# Patient Record
Sex: Female | Born: 1990 | Race: Black or African American | Hispanic: No | Marital: Single | Smoking: Former smoker
Health system: Southern US, Community
[De-identification: ages and names within clinical notes are randomized; demographics above are authoritative.]

## PROBLEM LIST (undated history)

## (undated) DIAGNOSIS — D649 Anemia, unspecified: Secondary | ICD-10-CM

## (undated) DIAGNOSIS — K8020 Calculus of gallbladder without cholecystitis without obstruction: Secondary | ICD-10-CM

## (undated) HISTORY — PX: OTHER SURGICAL HISTORY: SHX169

## (undated) HISTORY — PX: TONSILLECTOMY: SUR1361

---

## 2011-06-19 LAB — HEPATITIS B SURFACE ANTIGEN: Hepatitis B Surface Ag: NEGATIVE

## 2011-10-28 ENCOUNTER — Inpatient Hospital Stay (HOSPITAL_COMMUNITY)
Admission: AD | Admit: 2011-10-28 | Discharge: 2011-10-28 | Disposition: A | Discharge status: Home / Self Care | Payer: BC Managed Care – PPO | Source: Ambulatory Visit | Attending: Obstetrics and Gynecology | Admitting: Obstetrics and Gynecology

## 2011-10-28 ENCOUNTER — Encounter (HOSPITAL_COMMUNITY): Payer: Self-pay | Admitting: *Deleted

## 2011-10-28 DIAGNOSIS — O479 False labor, unspecified: Secondary | ICD-10-CM | POA: Insufficient documentation

## 2011-10-28 HISTORY — DX: Anemia, unspecified: D64.9

## 2011-10-28 NOTE — Progress Notes (Signed)
Pt c/o contractions that were 6 min apart X60 for a while then spaced out but are longer and painful.  No bleeding or dysuria.

## 2011-10-29 ENCOUNTER — Inpatient Hospital Stay (HOSPITAL_COMMUNITY)
Admission: AD | Admit: 2011-10-29 | Discharge: 2011-11-01 | DRG: 373 | Disposition: A | Discharge status: Home / Self Care | Payer: BC Managed Care – PPO | Source: Ambulatory Visit | Attending: Obstetrics and Gynecology | Admitting: Obstetrics and Gynecology

## 2011-10-29 ENCOUNTER — Encounter (HOSPITAL_COMMUNITY): Payer: Self-pay | Admitting: *Deleted

## 2011-10-29 ENCOUNTER — Encounter (HOSPITAL_COMMUNITY): Payer: Self-pay | Admitting: Anesthesiology

## 2011-10-29 ENCOUNTER — Inpatient Hospital Stay (HOSPITAL_COMMUNITY): Payer: BC Managed Care – PPO | Admitting: Anesthesiology

## 2011-10-29 DIAGNOSIS — Z22330 Carrier of Group B streptococcus: Secondary | ICD-10-CM

## 2011-10-29 DIAGNOSIS — O99892 Other specified diseases and conditions complicating childbirth: Secondary | ICD-10-CM | POA: Diagnosis present

## 2011-10-29 LAB — ABO/RH

## 2011-10-29 LAB — CBC
MCH: 29.4 pg (ref 26.0–34.0)
Platelets: 263 10*3/uL (ref 150–400)
RBC: 4.35 MIL/uL (ref 3.87–5.11)
RDW: 13.3 % (ref 11.5–15.5)

## 2011-10-29 LAB — RAPID HIV SCREEN (WH-MAU): Rapid HIV Screen: NONREACTIVE

## 2011-10-29 MED ORDER — PENICILLIN G POTASSIUM 5000000 UNITS IJ SOLR
2.5000 10*6.[IU] | INTRAVENOUS | Status: DC
  Administered 2011-10-30: 2.5 10*6.[IU] via INTRAVENOUS
  Filled 2011-10-29 (×11): qty 2.5

## 2011-10-29 MED ORDER — BUTORPHANOL TARTRATE 2 MG/ML IJ SOLN: 2.0000 mg | INTRAMUSCULAR | Status: DC | PRN

## 2011-10-29 MED ORDER — FLEET ENEMA 7-19 GM/118ML RE ENEM: 1.0000 | ENEMA | RECTAL | Status: DC | PRN

## 2011-10-29 MED ORDER — ACETAMINOPHEN 325 MG PO TABS: 650.0000 mg | ORAL_TABLET | ORAL | Status: DC | PRN

## 2011-10-29 MED ORDER — PHENYLEPHRINE 40 MCG/ML (10ML) SYRINGE FOR IV PUSH (FOR BLOOD PRESSURE SUPPORT): 80.0000 ug | PREFILLED_SYRINGE | INTRAVENOUS | Status: DC | PRN

## 2011-10-29 MED ORDER — LACTATED RINGERS IV SOLN
500.0000 mL | Freq: Once | INTRAVENOUS | Status: AC
  Administered 2011-10-29: 1000 mL via INTRAVENOUS

## 2011-10-29 MED ORDER — ONDANSETRON HCL 4 MG/2ML IJ SOLN: 4.0000 mg | Freq: Four times a day (QID) | INTRAMUSCULAR | Status: DC | PRN

## 2011-10-29 MED ORDER — OXYTOCIN 20 UNITS IN LACTATED RINGERS INFUSION - SIMPLE
125.0000 mL/h | Freq: Once | INTRAVENOUS | Status: AC
  Administered 2011-10-30: 999 mL/h via INTRAVENOUS
  Filled 2011-10-29: qty 1000

## 2011-10-29 MED ORDER — DIPHENHYDRAMINE HCL 50 MG/ML IJ SOLN: 12.5000 mg | INTRAMUSCULAR | Status: DC | PRN

## 2011-10-29 MED ORDER — OXYTOCIN BOLUS FROM INFUSION
500.0000 mL | Freq: Once | INTRAVENOUS | Status: DC
  Filled 2011-10-29: qty 500

## 2011-10-29 MED ORDER — LIDOCAINE HCL (PF) 1 % IJ SOLN: 30.0000 mL | INTRAMUSCULAR | Status: DC | PRN

## 2011-10-29 MED ORDER — OXYCODONE-ACETAMINOPHEN 5-325 MG PO TABS: 2.0000 | ORAL_TABLET | ORAL | Status: DC | PRN

## 2011-10-29 MED ORDER — FENTANYL 2.5 MCG/ML BUPIVACAINE 1/10 % EPIDURAL INFUSION (WH - ANES)
14.0000 mL/h | INTRAMUSCULAR | Status: DC
  Administered 2011-10-29 – 2011-10-30 (×2): 14 mL/h via EPIDURAL
  Filled 2011-10-29 (×3): qty 60

## 2011-10-29 MED ORDER — LACTATED RINGERS IV SOLN
INTRAVENOUS | Status: DC
  Administered 2011-10-29: 20:00:00 via INTRAVENOUS

## 2011-10-29 MED ORDER — EPHEDRINE 5 MG/ML INJ: 10.0000 mg | INTRAVENOUS | Status: DC | PRN

## 2011-10-29 MED ORDER — LACTATED RINGERS IV SOLN: 500.0000 mL | INTRAVENOUS | Status: DC | PRN

## 2011-10-29 MED ORDER — EPHEDRINE 5 MG/ML INJ
10.0000 mg | INTRAVENOUS | Status: DC | PRN
  Filled 2011-10-29: qty 4

## 2011-10-29 MED ORDER — SODIUM CHLORIDE 0.9 % IV SOLN
2.0000 g | Freq: Once | INTRAVENOUS | Status: AC
  Administered 2011-10-29: 2 g via INTRAVENOUS
  Filled 2011-10-29: qty 2000

## 2011-10-29 MED ORDER — IBUPROFEN 600 MG PO TABS: 600.0000 mg | ORAL_TABLET | Freq: Four times a day (QID) | ORAL | Status: DC | PRN

## 2011-10-29 MED ORDER — FENTANYL 2.5 MCG/ML BUPIVACAINE 1/10 % EPIDURAL INFUSION (WH - ANES)
INTRAMUSCULAR | Status: DC | PRN
  Administered 2011-10-29: 14 mL/h via EPIDURAL

## 2011-10-29 MED ORDER — LIDOCAINE HCL 1.5 % IJ SOLN
INTRAMUSCULAR | Status: DC | PRN
  Administered 2011-10-29 (×2): 5 mL via EPIDURAL

## 2011-10-29 MED ORDER — PHENYLEPHRINE 40 MCG/ML (10ML) SYRINGE FOR IV PUSH (FOR BLOOD PRESSURE SUPPORT)
80.0000 ug | PREFILLED_SYRINGE | INTRAVENOUS | Status: DC | PRN
  Filled 2011-10-29: qty 5

## 2011-10-29 MED ORDER — CITRIC ACID-SODIUM CITRATE 334-500 MG/5ML PO SOLN: 30.0000 mL | ORAL | Status: DC | PRN

## 2011-10-29 NOTE — Anesthesia Procedure Notes (Signed)
Epidural Patient location during procedure: OB Start time: 10/29/2011 8:09 PM End time: 10/29/2011 8:14 PM Reason for block: procedure for pain  Staffing Anesthesiologist: Sandrea Hughs Performed by: anesthesiologist   Preanesthetic Checklist Completed: patient identified, site marked, surgical consent, pre-op evaluation, timeout performed, IV checked, risks and benefits discussed and monitors and equipment checked  Epidural Patient position: sitting Prep: site prepped and draped and DuraPrep Patient monitoring: continuous pulse ox and blood pressure Approach: midline Injection technique: LOR air  Needle:  Needle type: Tuohy  Needle gauge: 17 G Needle length: 9 cm Needle insertion depth: 7 cm Catheter type: closed end flexible Catheter size: 19 Gauge Catheter at skin depth: 12 cm Test dose: negative and 1.5% lidocaine  Assessment Sensory level: T8 Events: blood not aspirated, injection not painful, no injection resistance, negative IV test and no paresthesia

## 2011-10-29 NOTE — H&P (Signed)
Admission H&P:  Patient is a 20 y.o. G1P0 at [redacted]w[redacted]d who presents with complaints of contractions.  ? Leakage of fluid per nurse on admission.  Patient was 6 cm on admission and was admitted summarily to L&D.  Since then, patient received epidural and is now comfortable.    PMH:  None. PSH:  Tonsillectomy, tubes in ears. POB:  G1P0 PGYN:  + h/o chlam this pregnancy.  TOC neg.  Denies HSV, other std's, abnormal paps. Meds:  None. Allergies:  Claritin, lobster, stingray.  Prenatal labs:  B+, Rubella 28, NR, HepB neg, HIV not seen in prenatal labs.  PE:  AFVSS. Abd:  Nontender, gravid. SVE:  6/80/-1.  Sutures palpable.  140's-150's, + accels, no decels, + variability. Toco:  q 4-5 min.  AROM:  Moderate meconium noted.  No immediate complications noted.  Fetus tolerated procedure well.     A/P:  IUP at term, active labor.  No recent change from admission s/p epidural.  AROM performed.  Will check in 2 hours and if no change consider augmentation.  No HIV readily apparent in prenatal record.  Nurse called lab.  Will re-send if no HIV result per lab previously.  GBS positive; on penicillin.  Otherwise, expectant management.

## 2011-10-29 NOTE — Progress Notes (Signed)
Pt been contracting strong for the past two hours.  Says she is leaking some

## 2011-10-29 NOTE — Anesthesia Preprocedure Evaluation (Signed)
Anesthesia Evaluation  Patient identified by MRN, date of birth, ID band Patient awake    Reviewed: Allergy & Precautions, H&P , NPO status , Patient's Chart, lab work & pertinent test results  Airway Mallampati: II TM Distance: >3 FB Neck ROM: full    Dental No notable dental hx.    Pulmonary neg pulmonary ROS,    Pulmonary exam normal       Cardiovascular neg cardio ROS     Neuro/Psych Negative Neurological ROS  Negative Psych ROS   GI/Hepatic negative GI ROS, Neg liver ROS,   Endo/Other  Negative Endocrine ROS  Renal/GU negative Renal ROS  Genitourinary negative   Musculoskeletal negative musculoskeletal ROS (+)   Abdominal Normal abdominal exam  (+)   Peds negative pediatric ROS (+)  Hematology negative hematology ROS (+)   Anesthesia Other Findings   Reproductive/Obstetrics (+) Pregnancy                           Anesthesia Physical Anesthesia Plan  ASA: II  Anesthesia Plan: Epidural   Post-op Pain Management:    Induction:   Airway Management Planned:   Additional Equipment:   Intra-op Plan:   Post-operative Plan:   Informed Consent: I have reviewed the patients History and Physical, chart, labs and discussed the procedure including the risks, benefits and alternatives for the proposed anesthesia with the patient or authorized representative who has indicated his/her understanding and acceptance.     Plan Discussed with:   Anesthesia Plan Comments:         Anesthesia Quick Evaluation  

## 2011-10-30 ENCOUNTER — Encounter (HOSPITAL_COMMUNITY): Payer: Self-pay | Admitting: *Deleted

## 2011-10-30 LAB — ABO/RH: ABO/RH(D): B POS

## 2011-10-30 MED ORDER — ONDANSETRON HCL 4 MG PO TABS: 4.0000 mg | ORAL_TABLET | ORAL | Status: DC | PRN

## 2011-10-30 MED ORDER — OXYCODONE-ACETAMINOPHEN 5-325 MG PO TABS
1.0000 | ORAL_TABLET | ORAL | Status: DC | PRN
  Administered 2011-10-30: 1 via ORAL
  Filled 2011-10-30: qty 1

## 2011-10-30 MED ORDER — SENNOSIDES-DOCUSATE SODIUM 8.6-50 MG PO TABS
2.0000 | ORAL_TABLET | Freq: Every day | ORAL | Status: DC
  Administered 2011-10-30 – 2011-10-31 (×2): 2 via ORAL

## 2011-10-30 MED ORDER — IBUPROFEN 600 MG PO TABS
600.0000 mg | ORAL_TABLET | Freq: Four times a day (QID) | ORAL | Status: DC
  Administered 2011-10-30 – 2011-11-01 (×9): 600 mg via ORAL
  Filled 2011-10-30 (×9): qty 1

## 2011-10-30 MED ORDER — SIMETHICONE 80 MG PO CHEW: 80.0000 mg | CHEWABLE_TABLET | ORAL | Status: DC | PRN

## 2011-10-30 MED ORDER — PRENATAL PLUS 27-1 MG PO TABS
1.0000 | ORAL_TABLET | Freq: Every day | ORAL | Status: DC
  Administered 2011-10-30 – 2011-11-01 (×3): 1 via ORAL
  Filled 2011-10-30 (×3): qty 1

## 2011-10-30 MED ORDER — DIPHENHYDRAMINE HCL 25 MG PO CAPS: 25.0000 mg | ORAL_CAPSULE | Freq: Four times a day (QID) | ORAL | Status: DC | PRN

## 2011-10-30 MED ORDER — BENZOCAINE-MENTHOL 20-0.5 % EX AERO
1.0000 "application " | INHALATION_SPRAY | CUTANEOUS | Status: DC | PRN
  Administered 2011-10-31: 1 via TOPICAL

## 2011-10-30 MED ORDER — TETANUS-DIPHTH-ACELL PERTUSSIS 5-2.5-18.5 LF-MCG/0.5 IM SUSP: 0.5000 mL | Freq: Once | INTRAMUSCULAR | Status: DC

## 2011-10-30 MED ORDER — WITCH HAZEL-GLYCERIN EX PADS: 1.0000 "application " | MEDICATED_PAD | CUTANEOUS | Status: DC | PRN

## 2011-10-30 MED ORDER — ZOLPIDEM TARTRATE 5 MG PO TABS: 5.0000 mg | ORAL_TABLET | Freq: Every evening | ORAL | Status: DC | PRN

## 2011-10-30 MED ORDER — LANOLIN HYDROUS EX OINT: TOPICAL_OINTMENT | CUTANEOUS | Status: DC | PRN

## 2011-10-30 MED ORDER — ONDANSETRON HCL 4 MG/2ML IJ SOLN: 4.0000 mg | INTRAMUSCULAR | Status: DC | PRN

## 2011-10-30 MED ORDER — DIBUCAINE 1 % RE OINT: 1.0000 "application " | TOPICAL_OINTMENT | RECTAL | Status: DC | PRN

## 2011-10-30 MED ORDER — LIDOCAINE HCL (PF) 1 % IJ SOLN
INTRAMUSCULAR | Status: AC
  Filled 2011-10-30: qty 30

## 2011-10-30 NOTE — Anesthesia Postprocedure Evaluation (Signed)
Anesthesia Post Note  Patient: Barbara Shields  Procedure(s) Performed: * No procedures listed *  Anesthesia type: Epidural  Patient location: Mother/Baby  Post pain: Pain level controlled  Post assessment: Post-op Vital signs reviewed  Last Vitals:  Filed Vitals:   10/30/11 0523  BP: 122/70  Pulse: 88  Temp:   Resp: 18    Post vital signs: Reviewed  Level of consciousness: awake  Complications: No apparent anesthesia complications

## 2011-10-30 NOTE — Progress Notes (Signed)
Delivery Note:    Preop:  IUP at term, active labor, light-moderate meconium. Postop:  Same Procedure:  SVD/2nd Attending:  Jonetta Dagley EBL:  250 Path:  Cord blood. Comps:  None Anesthesia:  Epidural. Findings:  Viable infant female with Apgars of 8 at 1 min and 9 at 5 min weighing 6.b 7 oz.  Neonate bulb-suctioned at perineum.  Anterior shoulder easily delivered without difficulty.  Placenta delivered spontaneously; 3vc noted.  Placenta inspected and noted to be intact.  Small 2nd degree perineal laceration repaired in routine fashion with 2.0 vicryl.  Mother and infant stable after delivery.

## 2011-10-30 NOTE — Anesthesia Postprocedure Evaluation (Signed)
  Anesthesia Post-op Note  Patient: Barbara Shields  Procedure(s) Performed: * No procedures listed *  Patient Location: Mother/Baby  Anesthesia Type: Epidural  Level of Consciousness: awake  Airway and Oxygen Therapy: Patient Spontanous Breathing  Post-op Pain: none  Post-op Assessment: Patient's Cardiovascular Status Stable, Respiratory Function Stable, No signs of Nausea or vomiting, Adequate PO intake and Pain level controlled  Post-op Vital Signs: Reviewed and stable  Complications: No apparent anesthesia complications

## 2011-10-30 NOTE — Addendum Note (Signed)
Addendum  created 10/30/11 0906 by Suella Grove   Modules edited:Charges VN, Notes Section

## 2011-10-30 NOTE — Addendum Note (Signed)
Addendum  created 10/30/11 0906 by Lasheka Kempner C Nessie Nong   Modules edited:Charges VN, Notes Section    

## 2011-10-31 LAB — CBC
HCT: 32.3 % — ABNORMAL LOW (ref 36.0–46.0)
Hemoglobin: 10.8 g/dL — ABNORMAL LOW (ref 12.0–15.0)
MCV: 88 fL (ref 78.0–100.0)
RBC: 3.67 MIL/uL — ABNORMAL LOW (ref 3.87–5.11)
RDW: 13.7 % (ref 11.5–15.5)
WBC: 11.5 10*3/uL — ABNORMAL HIGH (ref 4.0–10.5)

## 2011-10-31 MED ORDER — BENZOCAINE-MENTHOL 20-0.5 % EX AERO
INHALATION_SPRAY | CUTANEOUS | Status: AC
  Filled 2011-10-31: qty 56

## 2011-10-31 NOTE — Progress Notes (Signed)
Post Partum Day 1 Subjective: up ad lib, voiding, tolerating PO and + flatus  Objective: Blood pressure 111/72, pulse 93, temperature 98.4 F (36.9 C), temperature source Oral, resp. rate 20, height 5\' 5"  (1.651 m), weight 89.359 kg (197 lb), SpO2 97.00%, unknown if currently breastfeeding.  Physical Exam:  General: alert and cooperative Lochia: appropriate Uterine Fundus: firm Incision: NA DVT Evaluation: No evidence of DVT seen on physical exam.   Basename 10/31/11 0510 10/29/11 1840  HGB 10.8* 12.8  HCT 32.3* 37.7    Assessment/Plan: Plan for discharge tomorrow and Circumcision prior to discharge   LOS: 2 days   Lynwood Kubisiak J. 10/31/2011, 8:18 AM

## 2011-11-01 MED ORDER — IBUPROFEN 600 MG PO TABS: 600.0000 mg | ORAL_TABLET | Freq: Four times a day (QID) | ORAL | Status: AC | PRN

## 2011-11-01 NOTE — Discharge Summary (Signed)
Obstetric Discharge Summary Reason for Admission: onset of labor Prenatal Procedures: none Intrapartum Procedures: spontaneous vaginal delivery Postpartum Procedures: none Complications-Operative and Postpartum: 2nd  degree perineal laceration Hemoglobin  Date Value Range Status  10/31/2011 10.8* 12.0-15.0 (g/dL) Final     HCT  Date Value Range Status  10/31/2011 32.3* 36.0-46.0 (%) Final    Discharge Diagnoses: Term Pregnancy-delivered  Discharge Information: Date: 11/01/2011 Activity: pelvic rest Diet: routine Medications: PNV and Ibuprofen Condition: stable Instructions: refer to practice specific booklet Discharge to: home Follow-up Information    Follow up with [name]. Make an appointment in 6 weeks.   Contact information:   [address] [place] [address] [phone]          Newborn Data: Live born female  Birth Weight: 6 lb 7.5 oz (2935 g) APGAR: 8, 9  Home with mother.  Barbara Henken J. 11/01/2011, 9:03 AM

## 2011-11-01 NOTE — Progress Notes (Signed)
Post Partum Day 2 Subjective: no complaints, up ad lib, voiding, tolerating PO and + flatus  Objective: Blood pressure 106/70, pulse 108, temperature 98.4 F (36.9 C), temperature source Oral, resp. rate 18, height 5\' 5"  (1.651 m), weight 89.359 kg (197 lb), SpO2 97.00%, unknown if currently breastfeeding.  Physical Exam:  General: alert and cooperative Lochia: appropriate Uterine Fundus: firm Incision: na  DVT Evaluation: No evidence of DVT seen on physical exam.   Basename 10/31/11 0510 10/29/11 1840  HGB 10.8* 12.8  HCT 32.3* 37.7    Assessment/Plan: Discharge home and Breastfeeding   LOS: 3 days   Barbara Shields J. 11/01/2011, 9:00 AM

## 2011-11-07 ENCOUNTER — Encounter (HOSPITAL_COMMUNITY)
Admission: RE | Admit: 2011-11-07 | Discharge: 2011-11-07 | Disposition: A | Discharge status: Home / Self Care | Payer: BC Managed Care – PPO | Source: Ambulatory Visit | Attending: Obstetrics and Gynecology | Admitting: Obstetrics and Gynecology

## 2011-11-07 DIAGNOSIS — O923 Agalactia: Secondary | ICD-10-CM | POA: Insufficient documentation

## 2011-12-08 ENCOUNTER — Encounter (HOSPITAL_COMMUNITY)
Admission: RE | Admit: 2011-12-08 | Discharge: 2011-12-08 | Disposition: A | Discharge status: Home / Self Care | Payer: BC Managed Care – PPO | Source: Ambulatory Visit | Attending: Obstetrics and Gynecology | Admitting: Obstetrics and Gynecology

## 2011-12-08 DIAGNOSIS — O923 Agalactia: Secondary | ICD-10-CM | POA: Insufficient documentation

## 2012-01-08 ENCOUNTER — Encounter (HOSPITAL_COMMUNITY)
Admission: RE | Admit: 2012-01-08 | Discharge: 2012-01-08 | Disposition: A | Discharge status: Home / Self Care | Payer: BC Managed Care – PPO | Source: Ambulatory Visit | Attending: Obstetrics and Gynecology | Admitting: Obstetrics and Gynecology

## 2012-01-08 DIAGNOSIS — O923 Agalactia: Secondary | ICD-10-CM | POA: Insufficient documentation

## 2012-02-08 ENCOUNTER — Encounter (HOSPITAL_COMMUNITY)
Admission: RE | Admit: 2012-02-08 | Discharge: 2012-02-08 | Disposition: A | Discharge status: Home / Self Care | Payer: BC Managed Care – PPO | Source: Ambulatory Visit | Attending: Obstetrics and Gynecology | Admitting: Obstetrics and Gynecology

## 2012-02-08 DIAGNOSIS — O923 Agalactia: Secondary | ICD-10-CM | POA: Insufficient documentation

## 2012-03-09 ENCOUNTER — Encounter (HOSPITAL_COMMUNITY)
Admission: RE | Admit: 2012-03-09 | Discharge: 2012-03-09 | Disposition: A | Discharge status: Home / Self Care | Payer: BC Managed Care – PPO | Source: Ambulatory Visit | Attending: Obstetrics and Gynecology | Admitting: Obstetrics and Gynecology

## 2012-03-09 DIAGNOSIS — O923 Agalactia: Secondary | ICD-10-CM | POA: Insufficient documentation

## 2012-04-10 ENCOUNTER — Other Ambulatory Visit: Payer: Self-pay | Admitting: Family Medicine

## 2012-04-10 ENCOUNTER — Inpatient Hospital Stay: Admission: RE | Admit: 2012-04-10 | Payer: BC Managed Care – PPO | Source: Ambulatory Visit

## 2012-04-16 ENCOUNTER — Ambulatory Visit
Admission: RE | Admit: 2012-04-16 | Discharge: 2012-04-16 | Disposition: A | Discharge status: Home / Self Care | Payer: BC Managed Care – PPO | Source: Ambulatory Visit | Attending: Family Medicine | Admitting: Family Medicine

## 2012-04-16 ENCOUNTER — Other Ambulatory Visit: Payer: Self-pay | Admitting: Family Medicine

## 2012-05-15 ENCOUNTER — Ambulatory Visit (INDEPENDENT_AMBULATORY_CARE_PROVIDER_SITE_OTHER): Payer: BC Managed Care – PPO | Admitting: General Surgery

## 2012-05-15 ENCOUNTER — Encounter (INDEPENDENT_AMBULATORY_CARE_PROVIDER_SITE_OTHER): Payer: Self-pay | Admitting: General Surgery

## 2012-05-15 DIAGNOSIS — K8020 Calculus of gallbladder without cholecystitis without obstruction: Secondary | ICD-10-CM

## 2012-05-15 NOTE — Progress Notes (Signed)
Patient ID: Francesco Runner, female   DOB: Aug 30, 1991, 20 y.o.   MRN: [id number]  Chief Complaint  Patient presents with  . Abdominal Pain    Evaluate Gallbladder w/stones    HPI AFTIN LYE is a 21 y.o. female.   HPI This patient was referred by Dr. Paulino Rily for evaluation of abdominal pain and cholelithiasis. She says that her pain began last May during her pregnancy and since then she has had intermittent episodes of right-sided abdominal pain located mainly in the right lower quadrant. She says this occurs every 3 weeks and last for about a week at a time when it comes. She says that it causes nausea and vomiting and right-sided pain without radiation. She says it is worse with greasy foods. She denies any fevers or chills and states that her bowels are normal without any blood in the stools or melena. She says her bowels are regular and she denies any dysuria or hematuria. She says her menstrual cycles are normal. The pain is becoming more consistent.  Past Medical History  Diagnosis Date  . Anemia     Past Surgical History  Procedure Date  . Tonsillectomy   . Ears tubes in ears    Family History  Problem Relation Age of Onset  . Hypertension Father   . Asthma Father   . Hypertension Maternal Grandmother   . Stroke Maternal Grandmother   . Mental illness Paternal Grandmother   . Mental illness Paternal Grandfather     Social History History  Substance Use Topics  . Smoking status: Former Smoker -- 0.1 packs/day    Types: Cigarettes    Quit date: 10/27/2009  . Smokeless tobacco: Not on file  . Alcohol Use: Yes     shots of vodka monthly during this pregnancy.    Allergies  Allergen Reactions  . Loratadine Hives  . Benzocaine-Menthol Hives    Sting ray  . Chocolate Hives  . Lobster (Shellfish Allergy) Hives    Current Outpatient Prescriptions  Medication Sig Dispense Refill  . calcium carbonate (TUMS - DOSED IN MG ELEMENTAL CALCIUM) 500 MG  chewable tablet Chew 2 tablets by mouth daily as needed. Patient uses this medication for heartburn.       . Fe Fum-FePoly-Vit C-Vit B3 (INTEGRA) 62.5-62.5-40-3 MG CAPS Take 1 capsule by mouth daily.        . prenatal vitamin w/FE, FA (PRENATAL 1 + 1) 27-1 MG TABS Take 1 tablet by mouth daily.          Review of Systems Review of Systems All other review of systems negative or noncontributory except as stated in the HPI  Blood pressure 128/82, temperature 98.4 F (36.9 C), resp. rate 18.  Physical Exam Physical Exam Physical Exam  Nursing note and vitals reviewed. Constitutional: She is oriented to person, place, and time. She appears well-developed and well-nourished. No distress.  HENT:  Head: Normocephalic and atraumatic.  Mouth/Throat: No oropharyngeal exudate.  Eyes: Conjunctivae and EOM are normal. Pupils are equal, round, and reactive to light. Right eye exhibits no discharge. Left eye exhibits no discharge. No scleral icterus.  Neck: Normal range of motion. Neck supple. No tracheal deviation present.  Cardiovascular: Normal rate, regular rhythm, normal heart sounds and intact distal pulses.   Pulmonary/Chest: Effort normal and breath sounds normal. No stridor. No respiratory distress. She has no wheezes.  Abdominal: Soft. Bowel sounds are normal. She exhibits no distension and no mass. There is no tenderness. There is no  rebound and no guarding.  Musculoskeletal: Normal range of motion. She exhibits no edema and no tenderness.  Neurological: She is alert and oriented to person, place, and time.  Skin: Skin is warm and dry. No rash noted. She is not diaphoretic. No erythema. No pallor.  Psychiatric: She has a normal mood and affect. Her behavior is normal. Judgment and thought content normal.    Data Reviewed [place] and labs  Assessment    Symptomatic cholelithiasis Now her location of the pain is not classic for gallbladder symptoms the remainder of her symptoms are fairly  classic. She does have cholelithiasis on ultrasound as well as normal set of LFTs. I discussed with her the options for continued observation versus cholecystectomy in July to proceed with cholecystectomy. I discussed with her the risks of procedure. The risks of infection, bleeding, pain, persistent symptoms, scarring, injury to bowel or bile ducts, retained stone, diarrhea, need for additional procedures, and need for open surgery discussed with the patient. She desires to proceed with cholecystectomy. Again, I did explain that since her symptoms are not located in the classic area and, there is a decent chance that she still has persistent symptoms despite cholecystectomy.     Plan    We will schedule cholecystectomy as soon as available.       Lodema Pilot DAVID 05/15/2012, 9:13 AM

## 2013-07-23 ENCOUNTER — Ambulatory Visit (INDEPENDENT_AMBULATORY_CARE_PROVIDER_SITE_OTHER): Payer: BC Managed Care – PPO | Admitting: General Surgery

## 2013-07-23 ENCOUNTER — Encounter (HOSPITAL_COMMUNITY): Payer: Self-pay | Admitting: Pharmacy Technician

## 2013-07-23 VITALS — BP 128/86 | HR 66 | Temp 98.0°F | Resp 18 | Ht 65.0 in | Wt 221.0 lb

## 2013-07-23 DIAGNOSIS — K8020 Calculus of gallbladder without cholecystitis without obstruction: Secondary | ICD-10-CM

## 2013-07-23 NOTE — Progress Notes (Signed)
Patient ID: Barbara Shields, female   DOB: 09-Nov-1991, 22 y.o.   MRN: [id number]  Chief Complaint  Patient presents with  . Establish Care    gallbladder    HPI Barbara Shields is a 22 y.o. female.  This patient is known to me for evaluation last year for symptomatic cholelithiasis. Surgery was scheduled at that time the tube financial reasons but she says that she continues to have persistent right-sided abdominal pain after eating which radiates to her back. She says that this comes about 2 times per month and lasts for about a week at a time. She has associated nausea but no vomiting. Denies any fevers or chills. She had which demonstrated cholelithiasis. She denies any other symptoms and states that her bowels are normal.  She says that she has a strong family history of similar gallbladder problems which resolved with cholecystectomy. HPI  Past Medical History  Diagnosis Date  . Anemia     Past Surgical History  Procedure Laterality Date  . Tonsillectomy    . Ears  tubes in ears    Family History  Problem Relation Age of Onset  . Hypertension Father   . Asthma Father   . Hypertension Maternal Grandmother   . Stroke Maternal Grandmother   . Mental illness Paternal Grandmother   . Mental illness Paternal Grandfather     Social History History  Substance Use Topics  . Smoking status: Former Smoker -- 0.10 packs/day    Types: Cigarettes    Quit date: 04/27/2011  . Smokeless tobacco: Not on file  . Alcohol Use: Yes     Comment: shots of vodka monthly during this pregnancy.    Allergies  Allergen Reactions  . Loratadine Hives  . Benzocaine-Menthol Hives    Sting ray  . Chocolate Hives  . Lobster (Shellfish Allergy) Hives    Current Outpatient Prescriptions  Medication Sig Dispense Refill  . calcium carbonate (TUMS - DOSED IN MG ELEMENTAL CALCIUM) 500 MG chewable tablet Chew 2 tablets by mouth daily as needed. Patient uses this medication for heartburn.        . etonogestrel (NEXPLANON) 68 MG IMPL implant Inject 1 each into the skin once.      . Fe Fum-FePoly-Vit C-Vit B3 (INTEGRA) 62.5-62.5-40-3 MG CAPS Take 1 capsule by mouth daily.        . prenatal vitamin w/FE, FA (PRENATAL 1 + 1) 27-1 MG TABS Take 1 tablet by mouth daily.         No current facility-administered medications for this visit.    Review of Systems Review of Systems All other review of systems negative or noncontributory except as stated in the HPI   Blood pressure 128/86, pulse 66, temperature 98 F (36.7 C), resp. rate 18, height 5\' 5"  (1.651 m), weight 221 lb (100.245 kg).  Physical Exam Physical Exam Physical Exam  Nursing note and vitals reviewed. Constitutional: She is oriented to person, place, and time. She appears well-developed and well-nourished. No distress.  HENT:  Head: Normocephalic and atraumatic.  Mouth/Throat: No oropharyngeal exudate.  Eyes: Conjunctivae and EOM are normal. Pupils are equal, round, and reactive to light. Right eye exhibits no discharge. Left eye exhibits no discharge. No scleral icterus.  Neck: Normal range of motion. Neck supple. No tracheal deviation present.  Cardiovascular: Normal rate, regular rhythm, normal heart sounds and intact distal pulses.   Pulmonary/Chest: Effort normal and breath sounds normal. No stridor. No respiratory distress. She has no wheezes.  Abdominal:  Soft. Bowel sounds are normal. She exhibits no distension and no mass. There is no tenderness. There is no rebound and no guarding.  Musculoskeletal: Normal range of motion. She exhibits no edema and no tenderness.  Neurological: She is alert and oriented to person, place, and time.  Skin: Skin is warm and dry. No rash noted. She is not diaphoretic. No erythema. No pallor.  Psychiatric: She has a normal mood and affect. Her behavior is normal. Judgment and thought content normal.    Data Reviewed   Assessment    Symptomatic cholelithiasis She has  symptoms which sound pretty classic for symptomatic cholelithiasis. I have discussed with her the options for continued observation versus cholecystectomy she would like to proceed with cholecystectomy. Discussed with her the procedure and its risks. The risks of infection, bleeding, pain, persistent symptoms, scarring, injury to bowel or bile ducts, retained stone, diarrhea, need for additional procedures, and need for open surgery discussed with the patient. She expressed understanding and desires to proceed with lap cholecystectomy.  I again explained that she has a risk of persistent symptoms postoperatively.      Plan    Will set her up for cholecystectomy when convenient for her.        Lodema Pilot DAVID 07/23/2013, 8:57 AM

## 2013-07-27 ENCOUNTER — Other Ambulatory Visit (HOSPITAL_COMMUNITY): Payer: Self-pay | Admitting: *Deleted

## 2013-07-28 ENCOUNTER — Encounter (HOSPITAL_COMMUNITY)
Admission: RE | Admit: 2013-07-28 | Discharge: 2013-07-28 | Disposition: A | Discharge status: Home / Self Care | Payer: BC Managed Care – PPO | Source: Ambulatory Visit | Attending: General Surgery | Admitting: General Surgery

## 2013-07-28 ENCOUNTER — Encounter (HOSPITAL_COMMUNITY): Payer: Self-pay

## 2013-07-28 VITALS — BP 137/90 | HR 84 | Temp 98.5°F | Resp 16 | Ht 65.0 in | Wt 220.5 lb

## 2013-07-28 DIAGNOSIS — K8020 Calculus of gallbladder without cholecystitis without obstruction: Secondary | ICD-10-CM

## 2013-07-28 HISTORY — DX: Calculus of gallbladder without cholecystitis without obstruction: K80.20

## 2013-07-28 LAB — CBC WITH DIFFERENTIAL/PLATELET
Basophils Absolute: 0 10*3/uL (ref 0.0–0.1)
Basophils Relative: 1 % (ref 0–1)
Eosinophils Absolute: 0.2 10*3/uL (ref 0.0–0.7)
Hemoglobin: 12.5 g/dL (ref 12.0–15.0)
MCH: 28.5 pg (ref 26.0–34.0)
MCHC: 33.3 g/dL (ref 30.0–36.0)
Neutro Abs: 2.4 10*3/uL (ref 1.7–7.7)
Neutrophils Relative %: 56 % (ref 43–77)
Platelets: 255 10*3/uL (ref 150–400)
RDW: 13.3 % (ref 11.5–15.5)

## 2013-07-28 LAB — COMPREHENSIVE METABOLIC PANEL
AST: 16 U/L (ref 0–37)
Albumin: 3.7 g/dL (ref 3.5–5.2)
Alkaline Phosphatase: 72 U/L (ref 39–117)
BUN: 6 mg/dL (ref 6–23)
Chloride: 104 mEq/L (ref 96–112)
Potassium: 3.7 mEq/L (ref 3.5–5.1)
Sodium: 139 mEq/L (ref 135–145)
Total Bilirubin: 0.5 mg/dL (ref 0.3–1.2)
Total Protein: 7.4 g/dL (ref 6.0–8.3)

## 2013-07-28 LAB — HCG, SERUM, QUALITATIVE: Preg, Serum: NEGATIVE

## 2013-07-28 NOTE — Patient Instructions (Addendum)
KILYN MARAGH  07/28/2013                           YOUR PROCEDURE IS SCHEDULED ON:07/30/13               PLEASE REPORT TO SHORT STAY CENTER AT :  5:15 am               CALL THIS NUMBER IF ANY PROBLEMS THE DAY OF SURGERY :               [phone]                      REMEMBER:   Do not eat food or drink liquids AFTER MIDNIGHT    Take these medicines the morning of surgery with A SIP OF WATER:  none   Do not wear jewelry, make-up   Do not wear lotions, powders, or perfumes.   Do not shave legs or underarms 12 hrs. before surgery (men may shave face)  Do not bring valuables to the hospital.  Contacts, dentures or bridgework may not be worn into surgery.  Leave suitcase in the car. After surgery it may be brought to your room.  For patients admitted to the hospital more than one night, checkout time is 11:00                          The day of discharge.   Patients discharged the day of surgery will not be allowed to drive home                             If going home same day of surgery, must have someone stay with you first                           24 hrs at home and arrange for some one to drive you home from hospital.    Special Instructions:   Please read over the following fact sheets that you were given:               1. MRSA  INFORMATION                      2. [facility] PREPARING FOR SURGERY SHEET                                                X_____________________________________________________________________        Failure to follow these instructions may result in cancellation of your surgery

## 2013-07-30 ENCOUNTER — Ambulatory Visit (HOSPITAL_COMMUNITY): Payer: BC Managed Care – PPO

## 2013-07-30 ENCOUNTER — Encounter (HOSPITAL_COMMUNITY)
Admission: RE | Disposition: A | Discharge status: Home / Self Care | Payer: Self-pay | Source: Ambulatory Visit | Attending: General Surgery

## 2013-07-30 ENCOUNTER — Encounter (HOSPITAL_COMMUNITY): Payer: Self-pay | Admitting: Anesthesiology

## 2013-07-30 ENCOUNTER — Ambulatory Visit (HOSPITAL_COMMUNITY)
Admission: RE | Admit: 2013-07-30 | Discharge: 2013-07-30 | Disposition: A | Discharge status: Home / Self Care | Payer: BC Managed Care – PPO | Source: Ambulatory Visit | Attending: General Surgery | Admitting: General Surgery

## 2013-07-30 ENCOUNTER — Ambulatory Visit (HOSPITAL_COMMUNITY): Payer: BC Managed Care – PPO | Admitting: Anesthesiology

## 2013-07-30 ENCOUNTER — Encounter (HOSPITAL_COMMUNITY): Payer: Self-pay

## 2013-07-30 DIAGNOSIS — K8010 Calculus of gallbladder with chronic cholecystitis without obstruction: Secondary | ICD-10-CM | POA: Insufficient documentation

## 2013-07-30 DIAGNOSIS — D649 Anemia, unspecified: Secondary | ICD-10-CM | POA: Insufficient documentation

## 2013-07-30 DIAGNOSIS — Z79899 Other long term (current) drug therapy: Secondary | ICD-10-CM | POA: Insufficient documentation

## 2013-07-30 DIAGNOSIS — K824 Cholesterolosis of gallbladder: Secondary | ICD-10-CM

## 2013-07-30 DIAGNOSIS — K8020 Calculus of gallbladder without cholecystitis without obstruction: Secondary | ICD-10-CM

## 2013-07-30 HISTORY — PX: CHOLECYSTECTOMY: SHX55

## 2013-07-30 SURGERY — LAPAROSCOPIC CHOLECYSTECTOMY WITH INTRAOPERATIVE CHOLANGIOGRAM
Anesthesia: General | Site: Abdomen | Wound class: Clean Contaminated

## 2013-07-30 MED ORDER — PROMETHAZINE HCL 25 MG/ML IJ SOLN
INTRAMUSCULAR | Status: AC
  Filled 2013-07-30: qty 1

## 2013-07-30 MED ORDER — BUPIVACAINE HCL (PF) 0.25 % IJ SOLN
INTRAMUSCULAR | Status: DC | PRN
  Administered 2013-07-30: 20 mL

## 2013-07-30 MED ORDER — HYDROMORPHONE HCL PF 1 MG/ML IJ SOLN
0.2500 mg | INTRAMUSCULAR | Status: DC | PRN
  Administered 2013-07-30: 0.5 mg via INTRAVENOUS

## 2013-07-30 MED ORDER — LACTATED RINGERS IV SOLN
INTRAVENOUS | Status: DC | PRN
  Administered 2013-07-30 (×2): via INTRAVENOUS

## 2013-07-30 MED ORDER — SODIUM CHLORIDE 0.9 % IR SOLN
Status: DC | PRN
  Administered 2013-07-30: 2 mL

## 2013-07-30 MED ORDER — BUPIVACAINE HCL (PF) 0.25 % IJ SOLN
INTRAMUSCULAR | Status: AC
  Filled 2013-07-30: qty 30

## 2013-07-30 MED ORDER — SUFENTANIL CITRATE 50 MCG/ML IV SOLN
INTRAVENOUS | Status: DC | PRN
  Administered 2013-07-30: 20 ug via INTRAVENOUS
  Administered 2013-07-30 (×2): 10 ug via INTRAVENOUS

## 2013-07-30 MED ORDER — CEFAZOLIN SODIUM-DEXTROSE 2-3 GM-% IV SOLR
INTRAVENOUS | Status: AC
  Filled 2013-07-30: qty 50

## 2013-07-30 MED ORDER — DEXAMETHASONE SODIUM PHOSPHATE 10 MG/ML IJ SOLN
INTRAMUSCULAR | Status: DC | PRN
  Administered 2013-07-30: 10 mg via INTRAVENOUS

## 2013-07-30 MED ORDER — GLYCOPYRROLATE 0.2 MG/ML IJ SOLN
INTRAMUSCULAR | Status: DC | PRN
  Administered 2013-07-30: 0.6 mg via INTRAVENOUS

## 2013-07-30 MED ORDER — NEOSTIGMINE METHYLSULFATE 1 MG/ML IJ SOLN
INTRAMUSCULAR | Status: DC | PRN
  Administered 2013-07-30: 4 mg via INTRAVENOUS

## 2013-07-30 MED ORDER — CEFAZOLIN SODIUM-DEXTROSE 2-3 GM-% IV SOLR
2.0000 g | INTRAVENOUS | Status: AC
  Administered 2013-07-30: 2 g via INTRAVENOUS

## 2013-07-30 MED ORDER — HYDROMORPHONE HCL PF 1 MG/ML IJ SOLN
INTRAMUSCULAR | Status: AC
  Filled 2013-07-30: qty 1

## 2013-07-30 MED ORDER — CISATRACURIUM BESYLATE (PF) 10 MG/5ML IV SOLN
INTRAVENOUS | Status: DC | PRN
  Administered 2013-07-30: 6 mg via INTRAVENOUS

## 2013-07-30 MED ORDER — HYDROCODONE-ACETAMINOPHEN 5-325 MG PO TABS: 1.0000 | ORAL_TABLET | ORAL | Status: DC | PRN

## 2013-07-30 MED ORDER — ONDANSETRON HCL 4 MG/2ML IJ SOLN
INTRAMUSCULAR | Status: DC | PRN
  Administered 2013-07-30: 4 mg via INTRAVENOUS

## 2013-07-30 MED ORDER — LIDOCAINE-EPINEPHRINE (PF) 1 %-1:200000 IJ SOLN
INTRAMUSCULAR | Status: DC | PRN
  Administered 2013-07-30: 20 mL

## 2013-07-30 MED ORDER — PROMETHAZINE HCL 25 MG/ML IJ SOLN
6.2500 mg | INTRAMUSCULAR | Status: DC | PRN
  Administered 2013-07-30: 6.25 mg via INTRAVENOUS

## 2013-07-30 MED ORDER — PROPOFOL 10 MG/ML IV BOLUS
INTRAVENOUS | Status: DC | PRN
  Administered 2013-07-30: 200 mg via INTRAVENOUS

## 2013-07-30 MED ORDER — LIDOCAINE HCL (CARDIAC) 20 MG/ML IV SOLN
INTRAVENOUS | Status: DC | PRN
  Administered 2013-07-30: 100 mg via INTRAVENOUS

## 2013-07-30 MED ORDER — LIDOCAINE-EPINEPHRINE 1 %-1:100000 IJ SOLN
INTRAMUSCULAR | Status: AC
  Filled 2013-07-30: qty 1

## 2013-07-30 MED ORDER — IOHEXOL 300 MG/ML  SOLN
INTRAMUSCULAR | Status: AC
  Filled 2013-07-30: qty 1

## 2013-07-30 MED ORDER — LACTATED RINGERS IR SOLN
Status: DC | PRN
  Administered 2013-07-30: 1000 mL

## 2013-07-30 MED ORDER — MIDAZOLAM HCL 5 MG/5ML IJ SOLN
INTRAMUSCULAR | Status: DC | PRN
  Administered 2013-07-30: 2 mg via INTRAVENOUS

## 2013-07-30 MED ORDER — LACTATED RINGERS IV SOLN: INTRAVENOUS | Status: DC

## 2013-07-30 MED ORDER — IOHEXOL 300 MG/ML  SOLN
INTRAMUSCULAR | Status: DC | PRN
  Administered 2013-07-30: 2 mL via INTRAVENOUS

## 2013-07-30 MED ORDER — SUCCINYLCHOLINE CHLORIDE 20 MG/ML IJ SOLN
INTRAMUSCULAR | Status: DC | PRN
  Administered 2013-07-30: 100 mg via INTRAVENOUS

## 2013-07-30 SURGICAL SUPPLY — 41 items
ADH SKN CLS APL DERMABOND .7 (GAUZE/BANDAGES/DRESSINGS) ×1
APPLICATOR COTTON TIP 6IN STRL (MISCELLANEOUS) IMPLANT
APPLIER CLIP LOGIC TI 5 (MISCELLANEOUS) ×2 IMPLANT
APPLIER CLIP ROT 10 11.4 M/L (STAPLE) ×2
APR CLP MED LRG 11.4X10 (STAPLE) ×1
APR CLP MED LRG 33X5 (MISCELLANEOUS) ×1
BAG SPEC RTRVL LRG 6X4 10 (ENDOMECHANICALS) ×1
CABLE HIGH FREQUENCY MONO STRZ (ELECTRODE) ×2 IMPLANT
CANISTER SUCTION 2500CC (MISCELLANEOUS) ×2 IMPLANT
CHLORAPREP W/TINT 26ML (MISCELLANEOUS) ×2 IMPLANT
CLIP APPLIE ROT 10 11.4 M/L (STAPLE) ×1 IMPLANT
CLOTH BEACON ORANGE TIMEOUT ST (SAFETY) ×2 IMPLANT
COVER SURGICAL LIGHT HANDLE (MISCELLANEOUS) ×2 IMPLANT
DECANTER SPIKE VIAL GLASS SM (MISCELLANEOUS) ×2 IMPLANT
DERMABOND ADVANCED (GAUZE/BANDAGES/DRESSINGS) ×1
DERMABOND ADVANCED .7 DNX12 (GAUZE/BANDAGES/DRESSINGS) IMPLANT
DRAPE C-ARM 42X120 X-RAY (DRAPES) ×1 IMPLANT
DRAPE LAPAROSCOPIC ABDOMINAL (DRAPES) ×2 IMPLANT
ELECT REM PT RETURN 9FT ADLT (ELECTROSURGICAL) ×2
ELECTRODE REM PT RTRN 9FT ADLT (ELECTROSURGICAL) ×1 IMPLANT
ENDOLOOP SUT PDS II  0 18 (SUTURE)
ENDOLOOP SUT PDS II 0 18 (SUTURE) IMPLANT
GLOVE SURG SS PI 7.5 STRL IVOR (GLOVE) ×4 IMPLANT
GOWN STRL NON-REIN LRG LVL3 (GOWN DISPOSABLE) ×2 IMPLANT
GOWN STRL REIN XL XLG (GOWN DISPOSABLE) ×6 IMPLANT
KIT BASIN OR (CUSTOM PROCEDURE TRAY) ×2 IMPLANT
NS IRRIG 1000ML POUR BTL (IV SOLUTION) ×2 IMPLANT
PENCIL BUTTON HOLSTER BLD 10FT (ELECTRODE) ×2 IMPLANT
POUCH SPECIMEN RETRIEVAL 10MM (ENDOMECHANICALS) ×2 IMPLANT
SCISSORS LAP 5X35 DISP (ENDOMECHANICALS) ×1 IMPLANT
SET CHOLANGIOGRAPH MIX (MISCELLANEOUS) ×2 IMPLANT
SET IRRIG TUBING LAPAROSCOPIC (IRRIGATION / IRRIGATOR) ×1 IMPLANT
STRIP CLOSURE SKIN 1/2X4 (GAUZE/BANDAGES/DRESSINGS) IMPLANT
SUT MNCRL AB 4-0 PS2 18 (SUTURE) ×2 IMPLANT
SUT VICRYL 0 UR6 27IN ABS (SUTURE) ×2 IMPLANT
TOWEL OR 17X26 10 PK STRL BLUE (TOWEL DISPOSABLE) ×2 IMPLANT
TRAY LAP CHOLE (CUSTOM PROCEDURE TRAY) ×2 IMPLANT
TROCAR BALLN 12MMX100 BLUNT (TROCAR) ×2 IMPLANT
TROCAR BLADELESS OPT 5 75 (ENDOMECHANICALS) ×4 IMPLANT
TROCAR XCEL NON-BLD 11X100MML (ENDOMECHANICALS) ×2 IMPLANT
TUBING INSUFFLATION 10FT LAP (TUBING) ×2 IMPLANT

## 2013-07-30 NOTE — H&P (View-Only) (Signed)
Patient ID: Barbara Shields, female   DOB: 12/10/1991, 22 y.o.   MRN: [id number]  Chief Complaint  Patient presents with  . Establish Care    gallbladder    HPI Barbara Shields is a 22 y.o. female.  This patient is known to me for evaluation last year for symptomatic cholelithiasis. Surgery was scheduled at that time the tube financial reasons but she says that she continues to have persistent right-sided abdominal pain after eating which radiates to her back. She says that this comes about 2 times per month and lasts for about a week at a time. She has associated nausea but no vomiting. Denies any fevers or chills. She had which demonstrated cholelithiasis. She denies any other symptoms and states that her bowels are normal.  She says that she has a strong family history of similar gallbladder problems which resolved with cholecystectomy. HPI  Past Medical History  Diagnosis Date  . Anemia     Past Surgical History  Procedure Laterality Date  . Tonsillectomy    . Ears  tubes in ears    Family History  Problem Relation Age of Onset  . Hypertension Father   . Asthma Father   . Hypertension Maternal Grandmother   . Stroke Maternal Grandmother   . Mental illness Paternal Grandmother   . Mental illness Paternal Grandfather     Social History History  Substance Use Topics  . Smoking status: Former Smoker -- 0.10 packs/day    Types: Cigarettes    Quit date: 04/27/2011  . Smokeless tobacco: Not on file  . Alcohol Use: Yes     Comment: shots of vodka monthly during this pregnancy.    Allergies  Allergen Reactions  . Loratadine Hives  . Benzocaine-Menthol Hives    Sting ray  . Chocolate Hives  . Lobster (Shellfish Allergy) Hives    Current Outpatient Prescriptions  Medication Sig Dispense Refill  . calcium carbonate (TUMS - DOSED IN MG ELEMENTAL CALCIUM) 500 MG chewable tablet Chew 2 tablets by mouth daily as needed. Patient uses this medication for heartburn.        . etonogestrel (NEXPLANON) 68 MG IMPL implant Inject 1 each into the skin once.      . Fe Fum-FePoly-Vit C-Vit B3 (INTEGRA) 62.5-62.5-40-3 MG CAPS Take 1 capsule by mouth daily.        . prenatal vitamin w/FE, FA (PRENATAL 1 + 1) 27-1 MG TABS Take 1 tablet by mouth daily.         No current facility-administered medications for this visit.    Review of Systems Review of Systems All other review of systems negative or noncontributory except as stated in the HPI   Blood pressure 128/86, pulse 66, temperature 98 F (36.7 C), resp. rate 18, height 5' 5" (1.651 m), weight 221 lb (100.245 kg).  Physical Exam Physical Exam Physical Exam  Nursing note and vitals reviewed. Constitutional: She is oriented to person, place, and time. She appears well-developed and well-nourished. No distress.  HENT:  Head: Normocephalic and atraumatic.  Mouth/Throat: No oropharyngeal exudate.  Eyes: Conjunctivae and EOM are normal. Pupils are equal, round, and reactive to light. Right eye exhibits no discharge. Left eye exhibits no discharge. No scleral icterus.  Neck: Normal range of motion. Neck supple. No tracheal deviation present.  Cardiovascular: Normal rate, regular rhythm, normal heart sounds and intact distal pulses.   Pulmonary/Chest: Effort normal and breath sounds normal. No stridor. No respiratory distress. She has no wheezes.  Abdominal:   Soft. Bowel sounds are normal. She exhibits no distension and no mass. There is no tenderness. There is no rebound and no guarding.  Musculoskeletal: Normal range of motion. She exhibits no edema and no tenderness.  Neurological: She is alert and oriented to person, place, and time.  Skin: Skin is warm and dry. No rash noted. She is not diaphoretic. No erythema. No pallor.  Psychiatric: She has a normal mood and affect. Her behavior is normal. Judgment and thought content normal.    Data Reviewed   Assessment    Symptomatic cholelithiasis She has  symptoms which sound pretty classic for symptomatic cholelithiasis. I have discussed with her the options for continued observation versus cholecystectomy she would like to proceed with cholecystectomy. Discussed with her the procedure and its risks. The risks of infection, bleeding, pain, persistent symptoms, scarring, injury to bowel or bile ducts, retained stone, diarrhea, need for additional procedures, and need for open surgery discussed with the patient. She expressed understanding and desires to proceed with lap cholecystectomy.  I again explained that she has a risk of persistent symptoms postoperatively.      Plan    Will set her up for cholecystectomy when convenient for her.        Paylin Hailu DAVID 07/23/2013, 8:57 AM    

## 2013-07-30 NOTE — Brief Op Note (Signed)
07/30/2013  8:45 AM  PATIENT:  Barbara Shields  22 y.o. female  PRE-OPERATIVE DIAGNOSIS:  cholelithiasis   POST-OPERATIVE DIAGNOSIS:  cholelithiasis   PROCEDURE:  Procedure(s): LAPAROSCOPIC CHOLECYSTECTOMY WITH INTRAOPERATIVE CHOLANGIOGRAM (N/A)  SURGEON:  Surgeon(s) and Role:    * Lodema Pilot, DO - Primary  PHYSICIAN ASSISTANT:   ASSISTANTS: Toth   ANESTHESIA:   general  EBL:  Total I/O In: 1000 [I.V.:1000] Out: -   BLOOD ADMINISTERED:none  DRAINS: none   LOCAL MEDICATIONS USED:  MARCAINE    and LIDOCAINE   SPECIMEN:  Source of Specimen:  gallbladder  DISPOSITION OF SPECIMEN:  PATHOLOGY  COUNTS:  YES  TOURNIQUET:  * No tourniquets in log *  DICTATION: .Other Dictation: Dictation Number [id number]  PLAN OF CARE: Discharge to home after PACU  PATIENT DISPOSITION:  PACU - hemodynamically stable.   Delay start of Pharmacological VTE agent (>24hrs) due to surgical blood loss or risk of bleeding: no

## 2013-07-30 NOTE — Preoperative (Signed)
Beta Blockers   Reason not to administer Beta Blockers:Not Applicable 

## 2013-07-30 NOTE — Anesthesia Procedure Notes (Signed)
Procedure Name: Intubation Date/Time: 07/30/2013 7:34 AM Performed by: Leroy Libman L Patient Re-evaluated:Patient Re-evaluated prior to inductionOxygen Delivery Method: Circle system utilized Preoxygenation: Pre-oxygenation with 100% oxygen Intubation Type: IV induction Ventilation: Mask ventilation without difficulty and Oral airway inserted - appropriate to patient size Laryngoscope Size: Hyacinth Meeker and 2 Grade View: Grade I Tube type: Oral Tube size: 7.5 mm Number of attempts: 1 Airway Equipment and Method: Stylet Placement Confirmation: ETT inserted through vocal cords under direct vision,  breath sounds checked- equal and bilateral and positive ETCO2 Secured at: 22 cm Tube secured with: Tape Dental Injury: Teeth and Oropharynx as per pre-operative assessment

## 2013-07-30 NOTE — Interval H&P Note (Signed)
History and Physical Interval Note:  07/30/2013 7:18 AM  Barbara Shields  has presented today for surgery, with the diagnosis of cholelithiasis   The various methods of treatment have been discussed with the patient and family. After consideration of risks, benefits and other options for treatment, the patient has consented to  Procedure(s): LAPAROSCOPIC CHOLECYSTECTOMY WITH INTRAOPERATIVE CHOLANGIOGRAM (N/A) as a surgical intervention .  The patient's history has been reviewed, patient examined, no change in status, stable for surgery.  I have reviewed the patient's chart and labs.  Questions were answered to the patient's satisfaction.  She was seen in the preop area.  Risks of the procedure again discussed.  The risks of infection, bleeding, pain, persistent symptoms, scarring, injury to bowel or bile ducts, retained stone, diarrhea, need for additional procedures, and need for open surgery discussed with the patient. She desires to proceed with lap chole possible IOC   Lodema Pilot DAVID

## 2013-07-30 NOTE — Anesthesia Postprocedure Evaluation (Signed)
  Anesthesia Post-op Note  Patient: Barbara Shields  Procedure(s) Performed: Procedure(s) (LRB): LAPAROSCOPIC CHOLECYSTECTOMY WITH INTRAOPERATIVE CHOLANGIOGRAM (N/A)  Patient Location: PACU  Anesthesia Type: General  Level of Consciousness: awake and alert   Airway and Oxygen Therapy: Patient Spontanous Breathing  Post-op Pain: mild  Post-op Assessment: Post-op Vital signs reviewed, Patient's Cardiovascular Status Stable, Respiratory Function Stable, Patent Airway and No signs of Nausea or vomiting  Last Vitals:  Filed Vitals:   07/30/13 1230  BP: 132/75  Pulse: 103  Temp: 36.1 C  Resp: 16    Post-op Vital Signs: stable   Complications: No apparent anesthesia complications

## 2013-07-30 NOTE — Transfer of Care (Signed)
Immediate Anesthesia Transfer of Care Note  Patient: Barbara Shields  Procedure(s) Performed: Procedure(s): LAPAROSCOPIC CHOLECYSTECTOMY WITH INTRAOPERATIVE CHOLANGIOGRAM (N/A)  Patient Location: PACU  Anesthesia Type:General  Level of Consciousness: awake, alert  and oriented  Airway & Oxygen Therapy: Patient Spontanous Breathing and Patient connected to face mask oxygen  Post-op Assessment: Report given to PACU RN and Post -op Vital signs reviewed and stable  Post vital signs: Reviewed and stable  Complications: No apparent anesthesia complications

## 2013-07-30 NOTE — Op Note (Signed)
NAMEMCKENSIE, Barbara NO.:  [id number]  MEDICAL RECORD NO.:  [id number]  LOCATION:  [facility]                         FACILITY:  [facility]  PHYSICIAN:  Lodema Pilot, MD       DATE OF BIRTH:  [birth year]  DATE OF PROCEDURE:  07/30/2013 DATE OF DISCHARGE:  07/30/2013                              OPERATIVE REPORT   PROCEDURE:  Laparoscopic cholecystectomy with intraoperative cholangiogram.  PREOPERATIVE DIAGNOSIS:  Symptomatic cholelithiasis.  POSTOPERATIVE DIAGNOSIS:  Symptomatic cholelithiasis.  SURGEON:  Lodema Pilot, MD  ASSISTANT:  Dr. Tawanna Cooler.  ANESTHESIA:  General endotracheal anesthesia with 40 mL of 1% lidocaine with epinephrine and 0.25% Marcaine in a 50:50 mixture.  FLUIDS:  1200 mL crystalloid.  ESTIMATED BLOOD LOSS:  Minimal.  DRAINS:  None.  SPECIMENS:  Gallbladder contents sent to PATHOLOGY for permanent sectioning.  COMPLICATIONS:  None apparent.  FINDINGS:  Normal-appearing gallbladder with large gallstones within the lumen and normal cholangiogram.  INDICATION FOR PROCEDURE:  Ms. Marut is a 22 year old female with classic symptomatic cholelithiasis.  An ultrasound done which revealed gallstones on ultrasound, who desires cholecystectomy for relief of her symptoms.  DESCRIPTION OF PROCEDURE:  Ms. Strohecker was seen and evaluated in the preoperative area and the risks and benefits of procedure were again discussed in lay terms.  Informed consent was obtained.  She was given prophylactic antibiotics and taken to the operating room, placed on table in supine position.  General endotracheal anesthesia obtained. Her abdomen was prepped and draped in a standard surgical fashion. Procedure time-out was performed with all operative team members to confirm proper patient and procedure.  A supraumbilical midline incision was made in the skin and dissection carried down to subcutaneous tissue using blunt dissection.  Abdominal fascia was elevated  and sharply incised.  Laparoscope was introduced and there was no evidence of bowel injury or bleeding upon entry.  Two 5-mm right upper quadrant ports and 11 mm epigastric port was placed under direct visualization.  The gallbladder was retracted cephalad and the omental attachments were taken down with blunt dissection.  The gallbladder was not inflamed and she had normal-appearing anatomy.  The cystic artery was identified in its usual anatomic position, except there was a little bit more overlying the cystic duct.  I separated this from the cystic duct and a clip was placed on the artery and the artery was divided and the cystic duct was skeletonized and a critical view of safety was obtained visualizing a single cystic duct entering the gallbladder and liver parenchyma through the triangle of Calot.  Clip was placed on the gallbladder side of the cystic duct and a small cystic ductotomy was made.  Cholangiogram catheter was passed through the abdominal wall and into the cystic duct and cholangiogram was performed, which demonstrated a normal common bile duct with free flow of bile into the duodenum and normal right and left hepatic ducts.  The cholangiogram catheter was removed and 2 clips were placed on the cystic duct stump and the duct was transected.  The gallbladder was then removed from the gallbladder fossa using Bovie electrocautery.  The gallbladder was completely removed from the gallbladder fossa and placed in an EndoCatch bag and removed from the  umbilical trocar site and sent to Pathology for permanent sectioning.  The gallbladder fossa was inspected for hemostasis, which was noted be adequate and the clips appeared to be in good position.  The right upper quadrant was irrigated and suctioned until the irrigation returned clear, and the umbilical fascia was then approximated with interrupted 3-0 Vicryl sutures in open fashion. Sutures were secured and the abdomen was  re-insufflated through the epigastric trocar site.  The laparoscope was reintroduced and the abdominal wall closure was noted be adequate without any evidence of bleeding or bowel injury.  Right upper quadrant appeared hemostatic without any evidence of bleeding or bowel injury.  The wounds were injected with a total of 40 mL of 1% lidocaine with epinephrine and 0.25% Marcaine in a 50:50 mixture and the skin edges were approximated with 4-0 Monocryl subcuticular suture.  Skin was washed and dried and Dermabond was applied.  All sponge, needle, and instrument counts correct at the end of the case.  The patient tolerated the procedure well without apparent complication.          ______________________________ Lodema Pilot, MD     BL/MEDQ  D:  07/30/2013  T:  07/30/2013  Job:  [id number]

## 2013-07-30 NOTE — Anesthesia Preprocedure Evaluation (Signed)
Anesthesia Evaluation  Patient identified by MRN, date of birth, ID band Patient awake    Reviewed: Allergy & Precautions, H&P , NPO status , Patient's Chart, lab work & pertinent test results  Airway Mallampati: II TM Distance: >3 FB Neck ROM: Full    Dental no notable dental hx.    Pulmonary neg pulmonary ROS, former smoker,  breath sounds clear to auscultation  Pulmonary exam normal       Cardiovascular negative cardio ROS  Rhythm:Regular Rate:Normal     Neuro/Psych negative neurological ROS  negative psych ROS   GI/Hepatic negative GI ROS, Neg liver ROS,   Endo/Other  negative endocrine ROS  Renal/GU negative Renal ROS  negative genitourinary   Musculoskeletal negative musculoskeletal ROS (+)   Abdominal (+) + obese,   Peds negative pediatric ROS (+)  Hematology negative hematology ROS (+)   Anesthesia Other Findings   Reproductive/Obstetrics Negative pregnancy test.                           Anesthesia Physical Anesthesia Plan  ASA: II  Anesthesia Plan: General   Post-op Pain Management:    Induction: Intravenous  Airway Management Planned: Oral ETT  Additional Equipment:   Intra-op Plan:   Post-operative Plan: Extubation in OR  Informed Consent: I have reviewed the patients History and Physical, chart, labs and discussed the procedure including the risks, benefits and alternatives for the proposed anesthesia with the patient or authorized representative who has indicated his/her understanding and acceptance.   Dental advisory given  Plan Discussed with: CRNA  Anesthesia Plan Comments:         Anesthesia Quick Evaluation

## 2013-07-31 ENCOUNTER — Telehealth (INDEPENDENT_AMBULATORY_CARE_PROVIDER_SITE_OTHER): Payer: Self-pay | Admitting: General Surgery

## 2013-07-31 ENCOUNTER — Encounter (HOSPITAL_COMMUNITY): Payer: Self-pay | Admitting: General Surgery

## 2013-07-31 NOTE — Telephone Encounter (Signed)
Spoke with pt and informed her she has a po appt w/ Dr. Biagio Quint on 08/14/13 at 8:45.

## 2013-08-14 ENCOUNTER — Ambulatory Visit (INDEPENDENT_AMBULATORY_CARE_PROVIDER_SITE_OTHER): Payer: BC Managed Care – PPO | Admitting: General Surgery

## 2013-08-14 ENCOUNTER — Encounter (INDEPENDENT_AMBULATORY_CARE_PROVIDER_SITE_OTHER): Payer: Self-pay | Admitting: General Surgery

## 2013-08-14 VITALS — BP 126/72 | HR 72 | Temp 98.1°F | Resp 14 | Ht 65.0 in | Wt 220.2 lb

## 2013-08-14 DIAGNOSIS — Z4889 Encounter for other specified surgical aftercare: Secondary | ICD-10-CM

## 2013-08-14 DIAGNOSIS — Z5189 Encounter for other specified aftercare: Secondary | ICD-10-CM

## 2013-08-14 NOTE — Progress Notes (Signed)
Subjective:     Patient ID: Barbara Shields, female   DOB: May 21, 1991, 22 y.o.   MRN: [id number]  HPI This patient follows up status post cholecystectomy. Her pathology was benign. She's doing very well and has no complaints. She says that her bowels are functioning normally and she is tolerating regular diet and is off all of her pain medication. She has returned to work and says that she has no complaints.  Review of Systems     Objective:   Physical Exam No distress and nontoxic-appearing Her abdomen is soft and nontender on exam and her incisions are healing nicely without any sign of infection.    Assessment:     Status post laparoscopic cholecystectomy- doing well She seems to be doing very well after surgery without any evidence of postoperative complications. She has not had any return of her preoperative symptoms and says that she is feeling well. She is return to regular activities and I would release her now with out any restrictions    Plan:     Increase activity as tolerated and followup when necessary

## 2014-10-18 ENCOUNTER — Encounter (INDEPENDENT_AMBULATORY_CARE_PROVIDER_SITE_OTHER): Payer: Self-pay | Admitting: General Surgery

## 2015-03-03 ENCOUNTER — Other Ambulatory Visit (HOSPITAL_COMMUNITY)
Admission: RE | Admit: 2015-03-03 | Discharge: 2015-03-03 | Disposition: A | Discharge status: Home / Self Care | Payer: BLUE CROSS/BLUE SHIELD | Source: Ambulatory Visit | Attending: Nurse Practitioner | Admitting: Nurse Practitioner

## 2015-03-03 ENCOUNTER — Other Ambulatory Visit: Payer: Self-pay | Admitting: Nurse Practitioner

## 2015-03-03 DIAGNOSIS — Z113 Encounter for screening for infections with a predominantly sexual mode of transmission: Secondary | ICD-10-CM | POA: Insufficient documentation

## 2015-03-03 DIAGNOSIS — Z01419 Encounter for gynecological examination (general) (routine) without abnormal findings: Secondary | ICD-10-CM | POA: Diagnosis not present

## 2015-03-07 LAB — CYTOLOGY - PAP

## 2017-01-25 DIAGNOSIS — N760 Acute vaginitis: Secondary | ICD-10-CM | POA: Diagnosis not present

## 2017-01-25 DIAGNOSIS — R1030 Lower abdominal pain, unspecified: Secondary | ICD-10-CM | POA: Diagnosis not present

## 2017-02-01 ENCOUNTER — Other Ambulatory Visit: Payer: Self-pay | Admitting: Family Medicine

## 2017-02-01 DIAGNOSIS — R1030 Lower abdominal pain, unspecified: Secondary | ICD-10-CM

## 2017-02-05 ENCOUNTER — Ambulatory Visit
Admission: RE | Admit: 2017-02-05 | Discharge: 2017-02-05 | Disposition: A | Discharge status: Home / Self Care | Payer: BLUE CROSS/BLUE SHIELD | Source: Ambulatory Visit | Attending: Family Medicine | Admitting: Family Medicine

## 2017-02-05 DIAGNOSIS — R1030 Lower abdominal pain, unspecified: Secondary | ICD-10-CM | POA: Diagnosis not present

## 2017-07-02 ENCOUNTER — Encounter (HOSPITAL_COMMUNITY): Payer: Self-pay | Admitting: Emergency Medicine

## 2017-07-02 ENCOUNTER — Emergency Department (HOSPITAL_COMMUNITY)
Admission: EM | Admit: 2017-07-02 | Discharge: 2017-07-02 | Disposition: A | Discharge status: Home / Self Care | Payer: BLUE CROSS/BLUE SHIELD | Attending: Emergency Medicine | Admitting: Emergency Medicine

## 2017-07-02 ENCOUNTER — Emergency Department (HOSPITAL_COMMUNITY): Payer: BLUE CROSS/BLUE SHIELD

## 2017-07-02 DIAGNOSIS — S0990XA Unspecified injury of head, initial encounter: Secondary | ICD-10-CM | POA: Diagnosis present

## 2017-07-02 DIAGNOSIS — M25532 Pain in left wrist: Secondary | ICD-10-CM | POA: Diagnosis not present

## 2017-07-02 DIAGNOSIS — Z79899 Other long term (current) drug therapy: Secondary | ICD-10-CM | POA: Insufficient documentation

## 2017-07-02 DIAGNOSIS — M79642 Pain in left hand: Secondary | ICD-10-CM | POA: Diagnosis not present

## 2017-07-02 DIAGNOSIS — Y9389 Activity, other specified: Secondary | ICD-10-CM | POA: Diagnosis not present

## 2017-07-02 DIAGNOSIS — Y92410 Unspecified street and highway as the place of occurrence of the external cause: Secondary | ICD-10-CM | POA: Diagnosis not present

## 2017-07-02 DIAGNOSIS — Z87891 Personal history of nicotine dependence: Secondary | ICD-10-CM | POA: Insufficient documentation

## 2017-07-02 DIAGNOSIS — R110 Nausea: Secondary | ICD-10-CM | POA: Insufficient documentation

## 2017-07-02 DIAGNOSIS — S060X0A Concussion without loss of consciousness, initial encounter: Secondary | ICD-10-CM

## 2017-07-02 DIAGNOSIS — Y999 Unspecified external cause status: Secondary | ICD-10-CM | POA: Diagnosis not present

## 2017-07-02 DIAGNOSIS — S6992XA Unspecified injury of left wrist, hand and finger(s), initial encounter: Secondary | ICD-10-CM | POA: Diagnosis not present

## 2017-07-02 MED ORDER — LIDOCAINE 5 % EX PTCH: 1.0000 | MEDICATED_PATCH | CUTANEOUS | 0 refills | Status: AC

## 2017-07-02 MED ORDER — METHOCARBAMOL 500 MG PO TABS: 500.0000 mg | ORAL_TABLET | Freq: Two times a day (BID) | ORAL | 0 refills | Status: AC

## 2017-07-02 MED ORDER — IBUPROFEN 600 MG PO TABS: 600.0000 mg | ORAL_TABLET | Freq: Four times a day (QID) | ORAL | 0 refills | Status: AC | PRN
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
## 2017-07-02 NOTE — Discharge Instructions (Signed)
Expect your soreness to increase over the next 2-3 days. Take it easy, but do not lay around too much as this may make any stiffness worse.    Antiinflammatory medications: Take 600 mg of ibuprofen every 6 hours or 440 mg (over the counter dose) to 500 mg (prescription dose) of naproxen every 12 hours or for the next 3 days. After this time, these medications may be used as needed for pain. Take these medications with food to avoid upset stomach. Choose only one of these medications, do not take them together.  Tylenol: Should you continue to have additional pain while taking the ibuprofen or naproxen, you may add in tylenol as needed. Your daily total maximum amount of tylenol from all sources should be limited to 4000mg /day for persons without liver problems, or 2000mg /day for those with liver problems. Muscle relaxer: Robaxin is a muscle relaxer and may help loosen stiff muscles. Do not take the Robaxin while driving or performing other dangerous activities.  Lidocaine patches: These are available via either prescription or over-the-counter. The over-the-counter option may be more economical one and are likely just as effective. There are multiple over-the-counter brands, such as Salonpas. Exercises: Be sure to perform the attached exercises starting with three times a week and working up to performing them daily. This is an essential part of preventing long term problems.   Concussion: You have signs of a concussion. It does not appear to be serious at this time.  In addition to the above recommendations, you should also strive to get more sleep, reduce stress, and significantly increase your water intake. Have a goal of about 0.5 L an hour.  Follow up with a primary care provider for any future management of these complaints, but we recommend a follow up in about a week to reevaluate your symptoms.
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
## 2018-01-06 DIAGNOSIS — F419 Anxiety disorder, unspecified: Secondary | ICD-10-CM | POA: Diagnosis not present

## 2018-01-27 DIAGNOSIS — F419 Anxiety disorder, unspecified: Secondary | ICD-10-CM | POA: Diagnosis not present

## 2018-02-07 IMAGING — US US TRANSVAGINAL NON-OB
1 series · 14 of 25 positions shown · non-contrast
Comparison: None

CLINICAL DATA: Lower abdominal pain for 3 weeks.

EXAM:
TRANSABDOMINAL AND TRANSVAGINAL ULTRASOUND OF PELVIS
TECHNIQUE: Both transabdominal and transvaginal ultrasound examinations of the
pelvis were performed. Transabdominal technique was performed for
global imaging of the pelvis including uterus, ovaries, adnexal
regions, and pelvic cul-de-sac. It was necessary to proceed with
endovaginal exam following the transabdominal exam to visualize the
endometrium and ovaries to better advantage.

[Series 1: us transvaginal non-ob · 0.18mm/px · 14 of 55 slices shown]
[im 1/55]
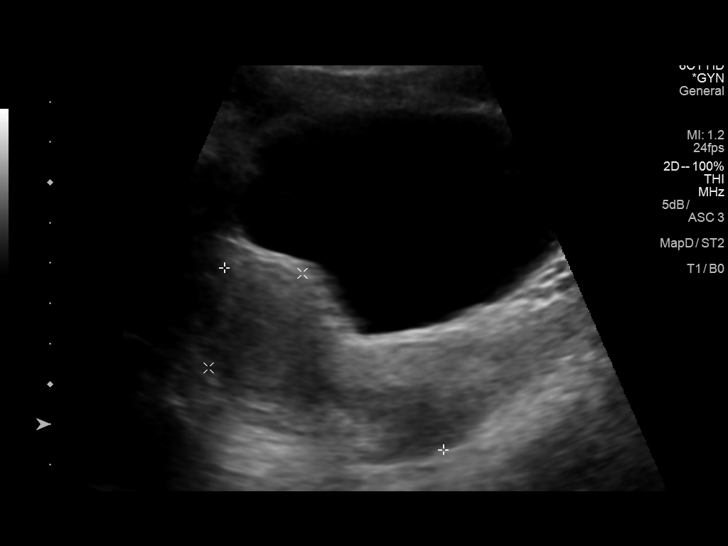
[im 5/55]
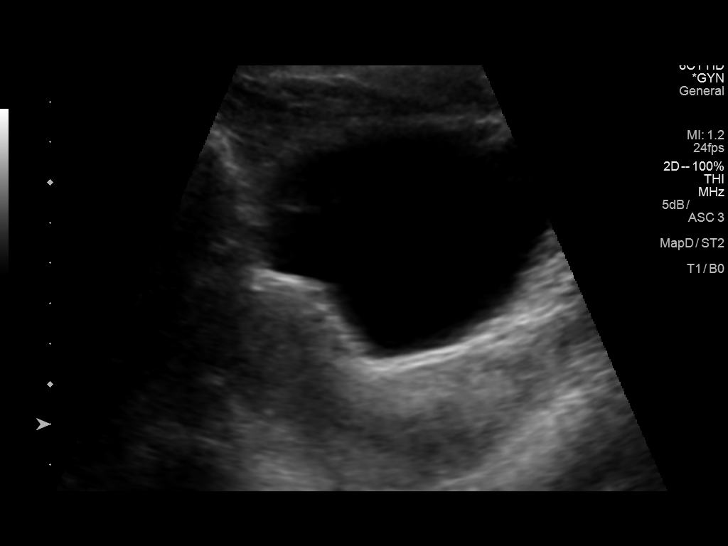
[im 10/55]
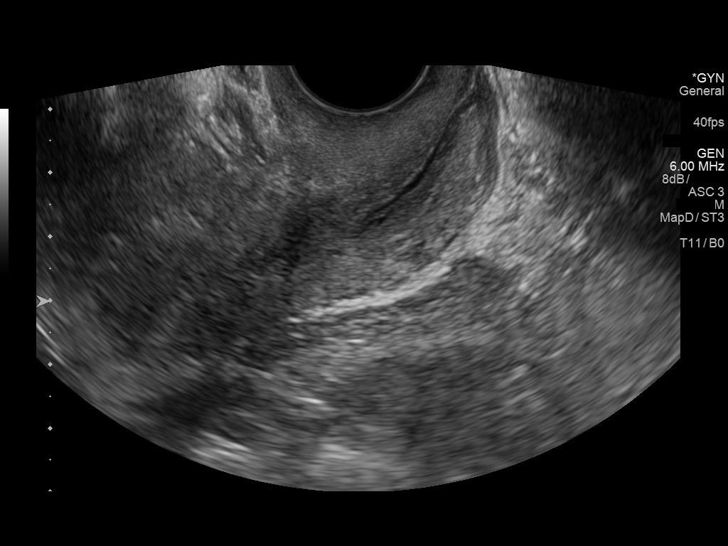
[im 14/55]
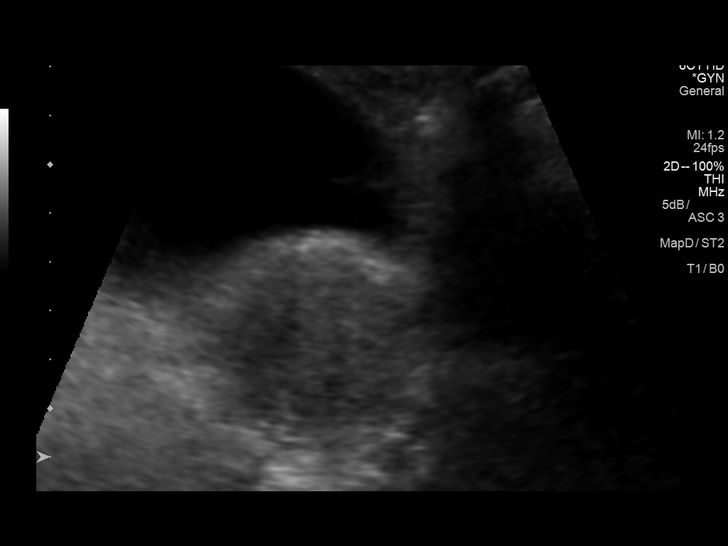
[im 19/55]
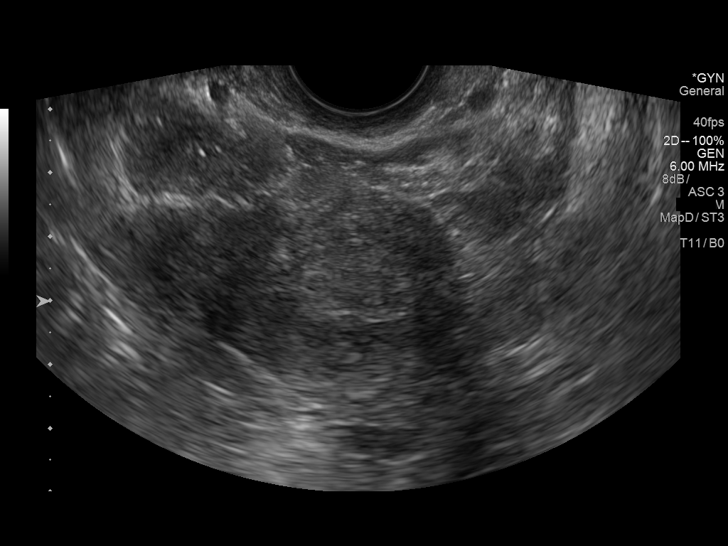
[im 21/55]
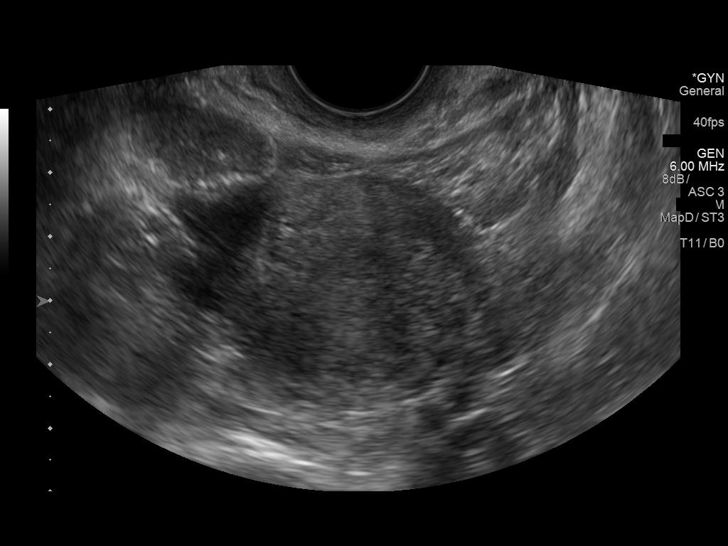
[im 25/55]
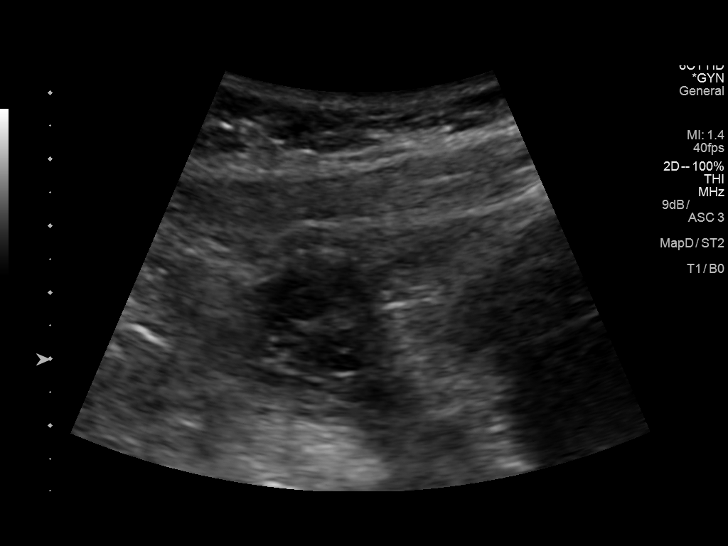
[im 30/55]
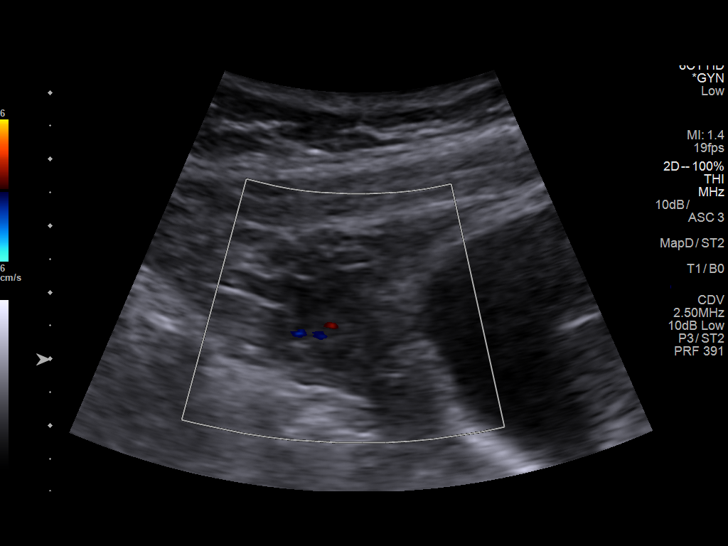
[im 34/55]
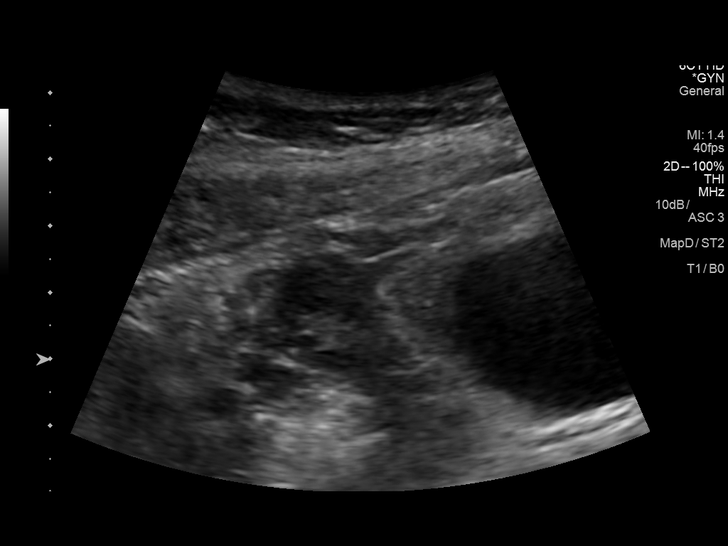
[im 37/55]
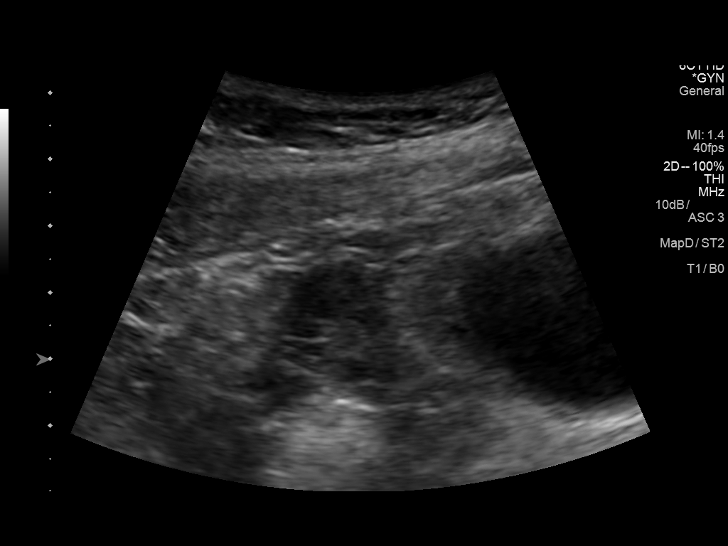
[im 41/55]
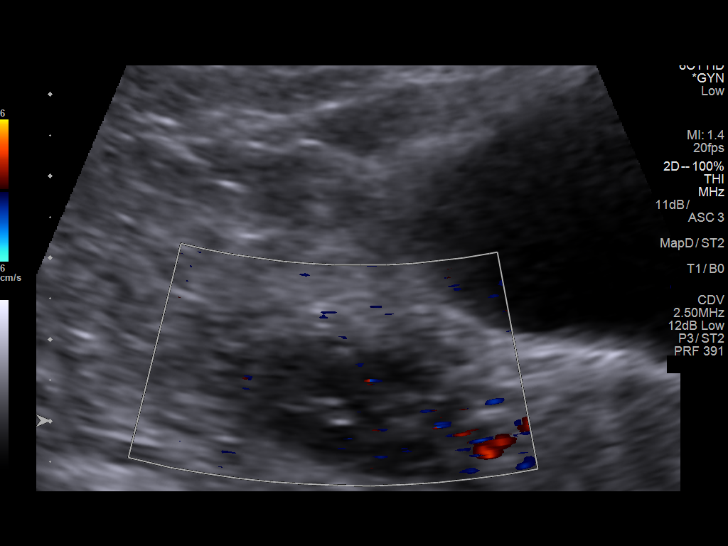
[im 46/55]
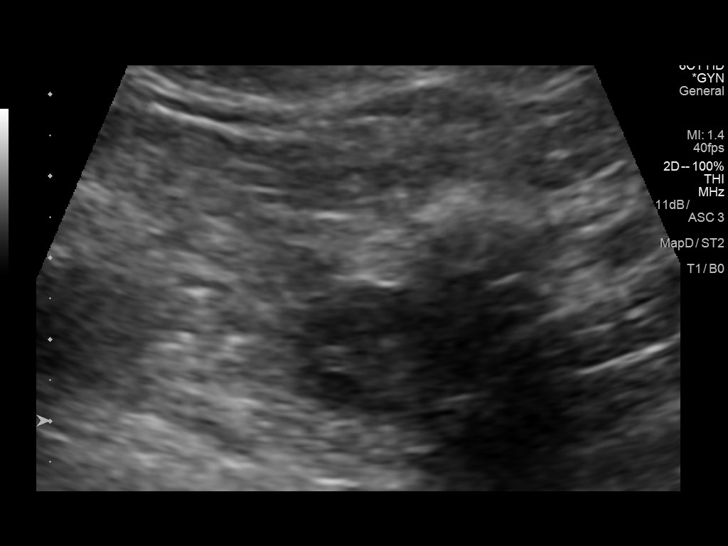
[im 50/55]
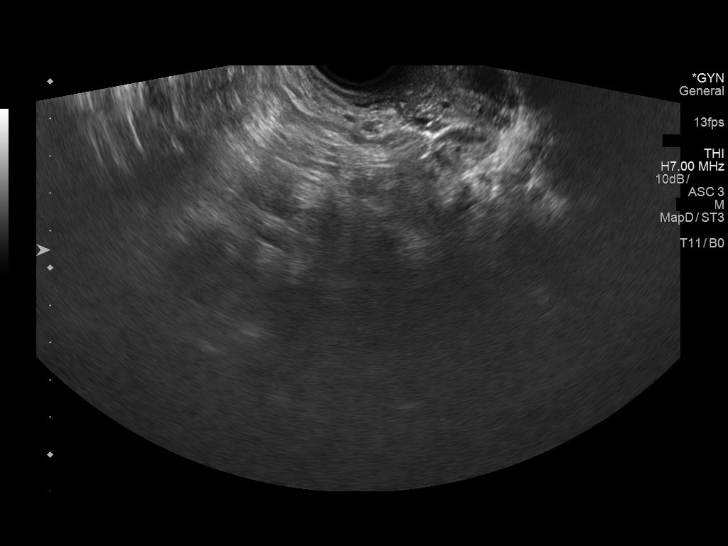
[im 55/55]
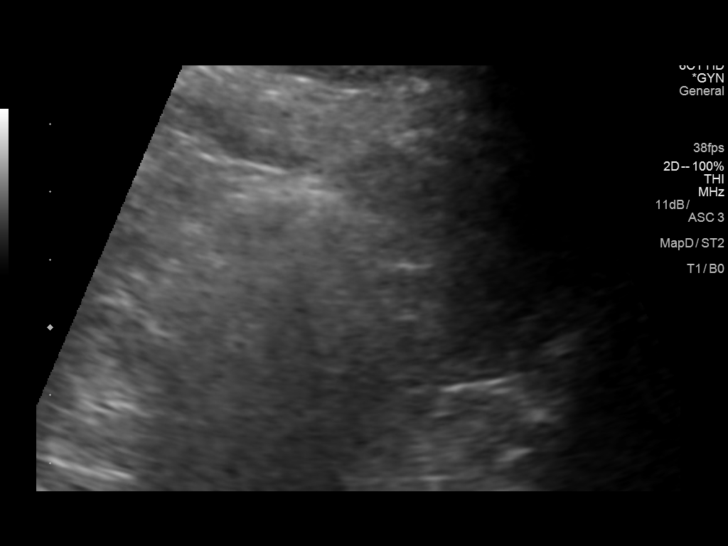

[14 of 25 positions shown; findings below may reference images not displayed]

FINDINGS: Uterus

Measurements: 7.0 x 3.3 x 3.9 cm. No fibroids or other mass
visualized.

Endometrium

Thickness: 6.3 mm.  No focal abnormality visualized.

Right ovary

Measurements: 3.0 x 2.1 x 1.8 cm. Normal appearance/no adnexal mass.

Left ovary

Measurements: 3.0 x 2.0 x 1.6 cm. Normal appearance/no adnexal mass.

Other findings

No abnormal free fluid.
IMPRESSION: Normal transabdominal and endovaginal pelvic ultrasound.

## 2018-02-14 DIAGNOSIS — F419 Anxiety disorder, unspecified: Secondary | ICD-10-CM | POA: Diagnosis not present

## 2018-02-24 DIAGNOSIS — F419 Anxiety disorder, unspecified: Secondary | ICD-10-CM | POA: Diagnosis not present

## 2018-03-05 DIAGNOSIS — F419 Anxiety disorder, unspecified: Secondary | ICD-10-CM | POA: Diagnosis not present

## 2018-03-17 DIAGNOSIS — F419 Anxiety disorder, unspecified: Secondary | ICD-10-CM | POA: Diagnosis not present

## 2018-03-27 DIAGNOSIS — F419 Anxiety disorder, unspecified: Secondary | ICD-10-CM | POA: Diagnosis not present

## 2018-04-07 DIAGNOSIS — F419 Anxiety disorder, unspecified: Secondary | ICD-10-CM | POA: Diagnosis not present

## 2018-04-10 ENCOUNTER — Other Ambulatory Visit (HOSPITAL_COMMUNITY)
Admission: RE | Admit: 2018-04-10 | Discharge: 2018-04-10 | Disposition: A | Discharge status: Home / Self Care | Payer: BLUE CROSS/BLUE SHIELD | Source: Ambulatory Visit | Attending: Nurse Practitioner | Admitting: Nurse Practitioner

## 2018-04-10 ENCOUNTER — Other Ambulatory Visit: Payer: Self-pay | Admitting: Nurse Practitioner

## 2018-04-10 DIAGNOSIS — Z01419 Encounter for gynecological examination (general) (routine) without abnormal findings: Secondary | ICD-10-CM | POA: Diagnosis not present

## 2018-04-10 DIAGNOSIS — Z3202 Encounter for pregnancy test, result negative: Secondary | ICD-10-CM | POA: Diagnosis not present

## 2018-04-10 DIAGNOSIS — Z113 Encounter for screening for infections with a predominantly sexual mode of transmission: Secondary | ICD-10-CM | POA: Diagnosis not present

## 2018-04-14 LAB — CYTOLOGY - PAP
CHLAMYDIA, DNA PROBE: NEGATIVE
DIAGNOSIS: NEGATIVE
NEISSERIA GONORRHEA: NEGATIVE

## 2018-04-21 DIAGNOSIS — F419 Anxiety disorder, unspecified: Secondary | ICD-10-CM | POA: Diagnosis not present

## 2018-05-01 DIAGNOSIS — Z30017 Encounter for initial prescription of implantable subdermal contraceptive: Secondary | ICD-10-CM | POA: Diagnosis not present

## 2018-05-01 DIAGNOSIS — Z308 Encounter for other contraceptive management: Secondary | ICD-10-CM | POA: Diagnosis not present

## 2018-05-01 DIAGNOSIS — Z3202 Encounter for pregnancy test, result negative: Secondary | ICD-10-CM | POA: Diagnosis not present

## 2018-05-05 DIAGNOSIS — F419 Anxiety disorder, unspecified: Secondary | ICD-10-CM | POA: Diagnosis not present

## 2018-05-19 DIAGNOSIS — F419 Anxiety disorder, unspecified: Secondary | ICD-10-CM | POA: Diagnosis not present

## 2018-06-02 DIAGNOSIS — F419 Anxiety disorder, unspecified: Secondary | ICD-10-CM | POA: Diagnosis not present

## 2018-06-16 DIAGNOSIS — F419 Anxiety disorder, unspecified: Secondary | ICD-10-CM | POA: Diagnosis not present

## 2018-06-30 DIAGNOSIS — F419 Anxiety disorder, unspecified: Secondary | ICD-10-CM | POA: Diagnosis not present

## 2018-07-28 DIAGNOSIS — F419 Anxiety disorder, unspecified: Secondary | ICD-10-CM | POA: Diagnosis not present

## 2018-08-11 DIAGNOSIS — F419 Anxiety disorder, unspecified: Secondary | ICD-10-CM | POA: Diagnosis not present

## 2018-08-25 DIAGNOSIS — F419 Anxiety disorder, unspecified: Secondary | ICD-10-CM | POA: Diagnosis not present

## 2018-09-01 ENCOUNTER — Other Ambulatory Visit: Payer: Self-pay

## 2018-09-01 ENCOUNTER — Encounter (HOSPITAL_COMMUNITY): Payer: Self-pay | Admitting: Emergency Medicine

## 2018-09-01 ENCOUNTER — Emergency Department (HOSPITAL_COMMUNITY): Payer: BLUE CROSS/BLUE SHIELD

## 2018-09-01 ENCOUNTER — Emergency Department (HOSPITAL_COMMUNITY)
Admission: EM | Admit: 2018-09-01 | Discharge: 2018-09-01 | Disposition: A | Discharge status: Home / Self Care | Payer: BLUE CROSS/BLUE SHIELD | Attending: Emergency Medicine | Admitting: Emergency Medicine

## 2018-09-01 DIAGNOSIS — Z5321 Procedure and treatment not carried out due to patient leaving prior to being seen by health care provider: Secondary | ICD-10-CM | POA: Diagnosis not present

## 2018-09-01 DIAGNOSIS — R079 Chest pain, unspecified: Secondary | ICD-10-CM | POA: Insufficient documentation

## 2018-09-01 DIAGNOSIS — R002 Palpitations: Secondary | ICD-10-CM | POA: Diagnosis not present

## 2018-09-01 LAB — BASIC METABOLIC PANEL
Anion gap: 11 (ref 5–15)
BUN: 10 mg/dL (ref 6–20)
CHLORIDE: 106 mmol/L (ref 98–111)
CO2: 22 mmol/L (ref 22–32)
CREATININE: 0.78 mg/dL (ref 0.44–1.00)
Calcium: 8.8 mg/dL — ABNORMAL LOW (ref 8.9–10.3)
GFR calc Af Amer: >60 mL/min (ref >60)
GLUCOSE: 89 mg/dL (ref 70–99)
Potassium: 3.7 mmol/L (ref 3.5–5.1)
SODIUM: 139 mmol/L (ref 135–145)

## 2018-09-01 LAB — CBC
HCT: 39.3 % (ref 36.0–46.0)
Hemoglobin: 12.5 g/dL (ref 12.0–15.0)
MCH: 28.3 pg (ref 26.0–34.0)
MCHC: 31.8 g/dL (ref 30.0–36.0)
MCV: 88.9 fL (ref 78.0–100.0)
Platelets: 290 10*3/uL (ref 150–400)
RBC: 4.42 MIL/uL (ref 3.87–5.11)
RDW: 13.6 % (ref 11.5–15.5)
WBC: 7.1 10*3/uL (ref 4.0–10.5)

## 2018-09-01 LAB — I-STAT BETA HCG BLOOD, ED (MC, WL, AP ONLY): I-stat hCG, quantitative: <5 m[IU]/mL (ref <5)

## 2018-09-01 LAB — I-STAT TROPONIN, ED: Troponin i, poc: 0 ng/mL (ref 0.00–0.08)

## 2018-09-01 NOTE — ED Triage Notes (Signed)
Pt to ER for evaluation of palpitations and heaviness in her chest onset today, reports radiating into left arm, reports feels numb. Pt in NAD.

## 2018-09-04 DIAGNOSIS — F410 Panic disorder [episodic paroxysmal anxiety] without agoraphobia: Secondary | ICD-10-CM | POA: Diagnosis not present

## 2018-09-04 DIAGNOSIS — R002 Palpitations: Secondary | ICD-10-CM | POA: Diagnosis not present

## 2018-09-04 DIAGNOSIS — R030 Elevated blood-pressure reading, without diagnosis of hypertension: Secondary | ICD-10-CM | POA: Diagnosis not present

## 2018-09-22 DIAGNOSIS — F419 Anxiety disorder, unspecified: Secondary | ICD-10-CM | POA: Diagnosis not present

## 2018-10-06 DIAGNOSIS — F329 Major depressive disorder, single episode, unspecified: Secondary | ICD-10-CM | POA: Diagnosis not present

## 2018-10-06 DIAGNOSIS — F419 Anxiety disorder, unspecified: Secondary | ICD-10-CM | POA: Diagnosis not present

## 2018-10-20 DIAGNOSIS — F419 Anxiety disorder, unspecified: Secondary | ICD-10-CM | POA: Diagnosis not present

## 2018-10-20 DIAGNOSIS — F329 Major depressive disorder, single episode, unspecified: Secondary | ICD-10-CM | POA: Diagnosis not present

## 2018-11-03 DIAGNOSIS — F329 Major depressive disorder, single episode, unspecified: Secondary | ICD-10-CM | POA: Diagnosis not present

## 2018-11-03 DIAGNOSIS — F419 Anxiety disorder, unspecified: Secondary | ICD-10-CM | POA: Diagnosis not present

## 2018-11-17 DIAGNOSIS — F329 Major depressive disorder, single episode, unspecified: Secondary | ICD-10-CM | POA: Diagnosis not present

## 2018-11-17 DIAGNOSIS — F419 Anxiety disorder, unspecified: Secondary | ICD-10-CM | POA: Diagnosis not present

## 2018-12-01 DIAGNOSIS — F419 Anxiety disorder, unspecified: Secondary | ICD-10-CM | POA: Diagnosis not present

## 2018-12-01 DIAGNOSIS — F329 Major depressive disorder, single episode, unspecified: Secondary | ICD-10-CM | POA: Diagnosis not present

## 2018-12-22 DIAGNOSIS — F329 Major depressive disorder, single episode, unspecified: Secondary | ICD-10-CM | POA: Diagnosis not present

## 2018-12-22 DIAGNOSIS — F419 Anxiety disorder, unspecified: Secondary | ICD-10-CM | POA: Diagnosis not present

## 2019-01-12 DIAGNOSIS — F419 Anxiety disorder, unspecified: Secondary | ICD-10-CM | POA: Diagnosis not present

## 2019-01-12 DIAGNOSIS — F329 Major depressive disorder, single episode, unspecified: Secondary | ICD-10-CM | POA: Diagnosis not present

## 2019-01-26 DIAGNOSIS — F419 Anxiety disorder, unspecified: Secondary | ICD-10-CM | POA: Diagnosis not present

## 2019-01-26 DIAGNOSIS — F329 Major depressive disorder, single episode, unspecified: Secondary | ICD-10-CM | POA: Diagnosis not present

## 2019-02-16 DIAGNOSIS — F419 Anxiety disorder, unspecified: Secondary | ICD-10-CM | POA: Diagnosis not present

## 2019-02-16 DIAGNOSIS — F329 Major depressive disorder, single episode, unspecified: Secondary | ICD-10-CM | POA: Diagnosis not present

## 2019-03-16 DIAGNOSIS — F329 Major depressive disorder, single episode, unspecified: Secondary | ICD-10-CM | POA: Diagnosis not present

## 2019-03-16 DIAGNOSIS — F419 Anxiety disorder, unspecified: Secondary | ICD-10-CM | POA: Diagnosis not present

## 2019-04-03 DIAGNOSIS — F419 Anxiety disorder, unspecified: Secondary | ICD-10-CM | POA: Diagnosis not present

## 2019-04-03 DIAGNOSIS — F329 Major depressive disorder, single episode, unspecified: Secondary | ICD-10-CM | POA: Diagnosis not present
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
## 2019-04-29 DIAGNOSIS — F419 Anxiety disorder, unspecified: Secondary | ICD-10-CM | POA: Diagnosis not present

## 2019-04-29 DIAGNOSIS — F329 Major depressive disorder, single episode, unspecified: Secondary | ICD-10-CM | POA: Diagnosis not present

## 2019-05-12 DIAGNOSIS — F329 Major depressive disorder, single episode, unspecified: Secondary | ICD-10-CM | POA: Diagnosis not present

## 2019-05-12 DIAGNOSIS — F419 Anxiety disorder, unspecified: Secondary | ICD-10-CM | POA: Diagnosis not present

## 2019-06-01 DIAGNOSIS — F419 Anxiety disorder, unspecified: Secondary | ICD-10-CM | POA: Diagnosis not present

## 2019-06-01 DIAGNOSIS — F329 Major depressive disorder, single episode, unspecified: Secondary | ICD-10-CM | POA: Diagnosis not present

## 2019-06-15 DIAGNOSIS — F329 Major depressive disorder, single episode, unspecified: Secondary | ICD-10-CM | POA: Diagnosis not present

## 2019-06-15 DIAGNOSIS — F419 Anxiety disorder, unspecified: Secondary | ICD-10-CM | POA: Diagnosis not present

## 2019-07-06 DIAGNOSIS — F419 Anxiety disorder, unspecified: Secondary | ICD-10-CM | POA: Diagnosis not present

## 2019-07-06 DIAGNOSIS — F329 Major depressive disorder, single episode, unspecified: Secondary | ICD-10-CM | POA: Diagnosis not present

## 2019-08-03 DIAGNOSIS — F419 Anxiety disorder, unspecified: Secondary | ICD-10-CM | POA: Diagnosis not present

## 2019-08-03 DIAGNOSIS — F329 Major depressive disorder, single episode, unspecified: Secondary | ICD-10-CM | POA: Diagnosis not present

## 2019-08-31 DIAGNOSIS — F419 Anxiety disorder, unspecified: Secondary | ICD-10-CM | POA: Diagnosis not present

## 2019-08-31 DIAGNOSIS — F329 Major depressive disorder, single episode, unspecified: Secondary | ICD-10-CM | POA: Diagnosis not present

## 2019-10-19 DIAGNOSIS — F329 Major depressive disorder, single episode, unspecified: Secondary | ICD-10-CM | POA: Diagnosis not present

## 2019-10-19 DIAGNOSIS — F419 Anxiety disorder, unspecified: Secondary | ICD-10-CM | POA: Diagnosis not present

## 2019-11-15 DIAGNOSIS — R519 Headache, unspecified: Secondary | ICD-10-CM | POA: Diagnosis not present

## 2019-11-15 DIAGNOSIS — Z20828 Contact with and (suspected) exposure to other viral communicable diseases: Secondary | ICD-10-CM | POA: Diagnosis not present

## 2019-11-15 DIAGNOSIS — R0981 Nasal congestion: Secondary | ICD-10-CM | POA: Diagnosis not present

## 2020-02-09 DIAGNOSIS — N898 Other specified noninflammatory disorders of vagina: Secondary | ICD-10-CM | POA: Diagnosis not present

## 2020-02-09 DIAGNOSIS — N760 Acute vaginitis: Secondary | ICD-10-CM | POA: Diagnosis not present

## 2020-02-09 DIAGNOSIS — N926 Irregular menstruation, unspecified: Secondary | ICD-10-CM | POA: Diagnosis not present

## 2020-02-09 DIAGNOSIS — E559 Vitamin D deficiency, unspecified: Secondary | ICD-10-CM | POA: Diagnosis not present

## 2020-03-15 DIAGNOSIS — F329 Major depressive disorder, single episode, unspecified: Secondary | ICD-10-CM | POA: Diagnosis not present

## 2020-03-15 DIAGNOSIS — F419 Anxiety disorder, unspecified: Secondary | ICD-10-CM | POA: Diagnosis not present

## 2020-03-29 DIAGNOSIS — F419 Anxiety disorder, unspecified: Secondary | ICD-10-CM | POA: Diagnosis not present

## 2020-05-12 DIAGNOSIS — E559 Vitamin D deficiency, unspecified: Secondary | ICD-10-CM | POA: Diagnosis not present

## 2021-01-18 DIAGNOSIS — N921 Excessive and frequent menstruation with irregular cycle: Secondary | ICD-10-CM | POA: Diagnosis not present

## 2021-01-18 DIAGNOSIS — R5383 Other fatigue: Secondary | ICD-10-CM | POA: Diagnosis not present

## 2021-02-14 DIAGNOSIS — Z30430 Encounter for insertion of intrauterine contraceptive device: Secondary | ICD-10-CM | POA: Diagnosis not present

## 2021-02-14 DIAGNOSIS — Z3202 Encounter for pregnancy test, result negative: Secondary | ICD-10-CM | POA: Diagnosis not present

## 2021-02-14 DIAGNOSIS — Z3046 Encounter for surveillance of implantable subdermal contraceptive: Secondary | ICD-10-CM | POA: Diagnosis not present

## 2021-03-30 DIAGNOSIS — Z30431 Encounter for routine checking of intrauterine contraceptive device: Secondary | ICD-10-CM | POA: Diagnosis not present

## 2021-04-25 DIAGNOSIS — F419 Anxiety disorder, unspecified: Secondary | ICD-10-CM | POA: Diagnosis not present

## 2021-05-23 DIAGNOSIS — F419 Anxiety disorder, unspecified: Secondary | ICD-10-CM | POA: Diagnosis not present

## 2021-06-06 DIAGNOSIS — F419 Anxiety disorder, unspecified: Secondary | ICD-10-CM | POA: Diagnosis not present

## 2021-07-11 DIAGNOSIS — F419 Anxiety disorder, unspecified: Secondary | ICD-10-CM | POA: Diagnosis not present

## 2021-08-01 DIAGNOSIS — F419 Anxiety disorder, unspecified: Secondary | ICD-10-CM | POA: Diagnosis not present

## 2021-08-15 DIAGNOSIS — F419 Anxiety disorder, unspecified: Secondary | ICD-10-CM | POA: Diagnosis not present

## 2021-08-31 DIAGNOSIS — F419 Anxiety disorder, unspecified: Secondary | ICD-10-CM | POA: Diagnosis not present

## 2021-09-26 DIAGNOSIS — F419 Anxiety disorder, unspecified: Secondary | ICD-10-CM | POA: Diagnosis not present

## 2021-09-29 DIAGNOSIS — Z124 Encounter for screening for malignant neoplasm of cervix: Secondary | ICD-10-CM | POA: Diagnosis not present

## 2021-09-29 DIAGNOSIS — Z01419 Encounter for gynecological examination (general) (routine) without abnormal findings: Secondary | ICD-10-CM | POA: Diagnosis not present

## 2021-09-29 DIAGNOSIS — Z113 Encounter for screening for infections with a predominantly sexual mode of transmission: Secondary | ICD-10-CM | POA: Diagnosis not present

## 2021-10-10 DIAGNOSIS — F419 Anxiety disorder, unspecified: Secondary | ICD-10-CM | POA: Diagnosis not present

## 2022-01-02 DIAGNOSIS — F419 Anxiety disorder, unspecified: Secondary | ICD-10-CM | POA: Diagnosis not present

## 2022-01-16 DIAGNOSIS — F419 Anxiety disorder, unspecified: Secondary | ICD-10-CM | POA: Diagnosis not present

## 2022-01-30 DIAGNOSIS — F419 Anxiety disorder, unspecified: Secondary | ICD-10-CM | POA: Diagnosis not present

## 2022-02-15 DIAGNOSIS — F419 Anxiety disorder, unspecified: Secondary | ICD-10-CM | POA: Diagnosis not present

## 2022-02-15 DIAGNOSIS — F32A Depression, unspecified: Secondary | ICD-10-CM | POA: Diagnosis not present

## 2022-02-20 DIAGNOSIS — F419 Anxiety disorder, unspecified: Secondary | ICD-10-CM | POA: Diagnosis not present

## 2022-03-13 DIAGNOSIS — F419 Anxiety disorder, unspecified: Secondary | ICD-10-CM | POA: Diagnosis not present

## 2022-05-03 DIAGNOSIS — F419 Anxiety disorder, unspecified: Secondary | ICD-10-CM | POA: Diagnosis not present

## 2022-05-16 DIAGNOSIS — F419 Anxiety disorder, unspecified: Secondary | ICD-10-CM | POA: Diagnosis not present

## 2022-05-30 DIAGNOSIS — F419 Anxiety disorder, unspecified: Secondary | ICD-10-CM | POA: Diagnosis not present

## 2022-06-13 DIAGNOSIS — F419 Anxiety disorder, unspecified: Secondary | ICD-10-CM | POA: Diagnosis not present

## 2022-07-04 DIAGNOSIS — F419 Anxiety disorder, unspecified: Secondary | ICD-10-CM | POA: Diagnosis not present

## 2022-07-18 DIAGNOSIS — F419 Anxiety disorder, unspecified: Secondary | ICD-10-CM | POA: Diagnosis not present

## 2022-08-08 DIAGNOSIS — F419 Anxiety disorder, unspecified: Secondary | ICD-10-CM | POA: Diagnosis not present

## 2022-08-14 DIAGNOSIS — Z30432 Encounter for removal of intrauterine contraceptive device: Secondary | ICD-10-CM | POA: Diagnosis not present

## 2022-08-23 DIAGNOSIS — F4321 Adjustment disorder with depressed mood: Secondary | ICD-10-CM | POA: Diagnosis not present

## 2022-08-29 DIAGNOSIS — F419 Anxiety disorder, unspecified: Secondary | ICD-10-CM | POA: Diagnosis not present

## 2022-09-05 DIAGNOSIS — F419 Anxiety disorder, unspecified: Secondary | ICD-10-CM | POA: Diagnosis not present

## 2022-09-11 DIAGNOSIS — F4321 Adjustment disorder with depressed mood: Secondary | ICD-10-CM | POA: Diagnosis not present

## 2022-09-13 DIAGNOSIS — F419 Anxiety disorder, unspecified: Secondary | ICD-10-CM | POA: Diagnosis not present

## 2022-10-09 DIAGNOSIS — F419 Anxiety disorder, unspecified: Secondary | ICD-10-CM | POA: Diagnosis not present

## 2022-10-23 DIAGNOSIS — Z01419 Encounter for gynecological examination (general) (routine) without abnormal findings: Secondary | ICD-10-CM | POA: Diagnosis not present

## 2022-10-23 DIAGNOSIS — Z113 Encounter for screening for infections with a predominantly sexual mode of transmission: Secondary | ICD-10-CM | POA: Diagnosis not present

## 2022-10-23 DIAGNOSIS — N898 Other specified noninflammatory disorders of vagina: Secondary | ICD-10-CM | POA: Diagnosis not present

## 2022-11-14 DIAGNOSIS — F419 Anxiety disorder, unspecified: Secondary | ICD-10-CM | POA: Diagnosis not present

## 2023-02-10 DIAGNOSIS — L03116 Cellulitis of left lower limb: Secondary | ICD-10-CM | POA: Diagnosis not present

## 2023-02-13 DIAGNOSIS — F4321 Adjustment disorder with depressed mood: Secondary | ICD-10-CM | POA: Diagnosis not present

## 2023-02-25 DIAGNOSIS — F4321 Adjustment disorder with depressed mood: Secondary | ICD-10-CM | POA: Diagnosis not present

## 2023-06-11 DIAGNOSIS — Z30011 Encounter for initial prescription of contraceptive pills: Secondary | ICD-10-CM | POA: Diagnosis not present

## 2023-06-11 DIAGNOSIS — N943 Premenstrual tension syndrome: Secondary | ICD-10-CM | POA: Diagnosis not present

## 2023-07-15 DIAGNOSIS — B353 Tinea pedis: Secondary | ICD-10-CM | POA: Diagnosis not present

## 2023-07-15 DIAGNOSIS — D229 Melanocytic nevi, unspecified: Secondary | ICD-10-CM | POA: Diagnosis not present

## 2023-08-20 DIAGNOSIS — Z1329 Encounter for screening for other suspected endocrine disorder: Secondary | ICD-10-CM | POA: Diagnosis not present

## 2023-08-20 DIAGNOSIS — Z131 Encounter for screening for diabetes mellitus: Secondary | ICD-10-CM | POA: Diagnosis not present

## 2023-08-20 DIAGNOSIS — R030 Elevated blood-pressure reading, without diagnosis of hypertension: Secondary | ICD-10-CM | POA: Diagnosis not present

## 2023-08-20 DIAGNOSIS — Z13220 Encounter for screening for lipoid disorders: Secondary | ICD-10-CM | POA: Diagnosis not present

## 2023-08-20 DIAGNOSIS — Z0000 Encounter for general adult medical examination without abnormal findings: Secondary | ICD-10-CM | POA: Diagnosis not present

## 2023-08-28 DIAGNOSIS — F4321 Adjustment disorder with depressed mood: Secondary | ICD-10-CM | POA: Diagnosis not present

## 2023-08-29 DIAGNOSIS — R030 Elevated blood-pressure reading, without diagnosis of hypertension: Secondary | ICD-10-CM | POA: Diagnosis not present

## 2023-08-29 DIAGNOSIS — G4719 Other hypersomnia: Secondary | ICD-10-CM | POA: Diagnosis not present

## 2023-09-02 DIAGNOSIS — F4321 Adjustment disorder with depressed mood: Secondary | ICD-10-CM | POA: Diagnosis not present

## 2023-09-10 DIAGNOSIS — F4321 Adjustment disorder with depressed mood: Secondary | ICD-10-CM | POA: Diagnosis not present

## 2023-09-16 DIAGNOSIS — D235 Other benign neoplasm of skin of trunk: Secondary | ICD-10-CM | POA: Diagnosis not present

## 2023-09-17 DIAGNOSIS — Z23 Encounter for immunization: Secondary | ICD-10-CM | POA: Diagnosis not present

## 2023-09-19 DIAGNOSIS — F4321 Adjustment disorder with depressed mood: Secondary | ICD-10-CM | POA: Diagnosis not present

## 2023-09-26 DIAGNOSIS — F4321 Adjustment disorder with depressed mood: Secondary | ICD-10-CM | POA: Diagnosis not present

## 2023-10-01 DIAGNOSIS — G4733 Obstructive sleep apnea (adult) (pediatric): Secondary | ICD-10-CM | POA: Diagnosis not present

## 2023-10-10 DIAGNOSIS — F4321 Adjustment disorder with depressed mood: Secondary | ICD-10-CM | POA: Diagnosis not present

## 2023-10-29 DIAGNOSIS — N898 Other specified noninflammatory disorders of vagina: Secondary | ICD-10-CM | POA: Diagnosis not present

## 2023-10-29 DIAGNOSIS — Z01419 Encounter for gynecological examination (general) (routine) without abnormal findings: Secondary | ICD-10-CM | POA: Diagnosis not present

## 2023-10-29 DIAGNOSIS — Z113 Encounter for screening for infections with a predominantly sexual mode of transmission: Secondary | ICD-10-CM | POA: Diagnosis not present

## 2024-08-12 DIAGNOSIS — F4321 Adjustment disorder with depressed mood: Secondary | ICD-10-CM | POA: Diagnosis not present

## 2024-08-20 DIAGNOSIS — B353 Tinea pedis: Secondary | ICD-10-CM | POA: Diagnosis not present

## 2024-08-20 DIAGNOSIS — Z0000 Encounter for general adult medical examination without abnormal findings: Secondary | ICD-10-CM | POA: Diagnosis not present

## 2024-08-20 DIAGNOSIS — E7800 Pure hypercholesterolemia, unspecified: Secondary | ICD-10-CM | POA: Diagnosis not present

## 2024-08-20 DIAGNOSIS — E559 Vitamin D deficiency, unspecified: Secondary | ICD-10-CM | POA: Diagnosis not present
